# Patient Record
Sex: Male | Born: 2000
Health system: Southern US, Community
[De-identification: ages and names within clinical notes are randomized; demographics above are authoritative.]

## PROBLEM LIST (undated history)

## (undated) DIAGNOSIS — K649 Unspecified hemorrhoids: Secondary | ICD-10-CM

## (undated) DIAGNOSIS — D649 Anemia, unspecified: Secondary | ICD-10-CM

## (undated) DIAGNOSIS — Z803 Family history of malignant neoplasm of breast: Secondary | ICD-10-CM

## (undated) DIAGNOSIS — Z800 Family history of malignant neoplasm of digestive organs: Secondary | ICD-10-CM

## (undated) DIAGNOSIS — K6289 Other specified diseases of anus and rectum: Secondary | ICD-10-CM

## (undated) HISTORY — DX: Family history of malignant neoplasm of digestive organs: Z80.0

## (undated) HISTORY — DX: Other specified diseases of anus and rectum: K62.89

## (undated) HISTORY — DX: Family history of malignant neoplasm of breast: Z80.3

---
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
## 2022-01-17 ENCOUNTER — Ambulatory Visit: Payer: 59 | Admitting: Gastroenterology

## 2022-03-07 ENCOUNTER — Ambulatory Visit: Payer: 59 | Admitting: Orthopedic Surgery

## 2022-03-07 DIAGNOSIS — Z9889 Other specified postprocedural states: Secondary | ICD-10-CM | POA: Diagnosis not present

## 2022-03-10 ENCOUNTER — Encounter: Payer: Self-pay | Admitting: Orthopedic Surgery

## 2022-03-10 NOTE — Progress Notes (Signed)
Office Visit Note   Patient: Glenn Walter           Date of Birth: [birth year]           MRN: [id number] Visit Date: 03/07/2022 Requested by: Nicholes Mango, MD No address on file PCP: Nicholes Mango, MD  Subjective: Chief Complaint  Patient presents with   Left Knee - Follow-up    HPI: Glenn Walter is a 21 year old patient with left knee ACL reconstruction with quad autograft performed 07/04/2021.  He had meniscal work done as well.  Overall he is doing well with no problems.  Primarily doing a lot of weight lifting and not a lot of cutting and pivoting activities.  He states his knee feels normal for the most part.              ROS: All systems reviewed are negative as they relate to the chief complaint within the history of present illness.  Patient denies  fevers or chills.   Assessment & Plan: Visit Diagnoses:  1. S/P ACL reconstruction     Plan: Impression is doing well following left knee ACL reconstruction with quad autograft.  Plan is to continue with strengthening protocol.  Recommended that he avoid running for exercise.  Cutting and pivoting activities should be undertaken with 1 to 2 months of agility training before getting into a game type situation.  Follow-up as needed.  Follow-Up Instructions: No follow-ups on file.   Orders:  No orders of the defined types were placed in this encounter.  No orders of the defined types were placed in this encounter.     Procedures: No procedures performed   Clinical Data: No additional findings.  Objective: Vital Signs: There were no vitals taken for this visit.  Physical Exam:   Constitutional: Patient appears well-developed HEENT:  Head: Normocephalic Eyes:EOM are normal Neck: Normal range of motion Cardiovascular: Normal rate Pulmonary/chest: Effort normal Neurologic: Patient is alert Skin: Skin is warm Psychiatric: Patient has normal mood and affect   Ortho Exam: Ortho exam demonstrates full active and  passive range of motion of the left knee.  No effusion.  Quad strength is excellent with no real appreciable atrophy on physical examination at mid thigh level.  Graft is stable to Lachman and anterior drawer with 1 to 2 mm anterior drawer solid endpoint.  Specialty Comments:  No specialty comments available.  Imaging: No results found.   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 08/29/2021   Rectal mass    Anemia due to GI blood loss 08/20/2021   Acute blood loss anemia (ABLA) 08/19/2021   Iron deficiency anemia 08/19/2021   Left anterior cruciate ligament tear    Acute medial meniscal tear, left, subsequent encounter    Past Medical History:  Diagnosis Date   Anemia    Family history of breast cancer    Family history of colon cancer    Family history of pancreatic cancer    Hemorrhoids     Family History  Problem Relation Age of Onset   Breast cancer Maternal Grandmother 23   Multiple myeloma Paternal Grandmother 22   Kidney cancer Paternal Grandfather 43   Breast cancer Other        MGA, MGGA, PGA   Colon cancer Other  MGU, MGGA x 2   Pancreatic cancer Other        PGA and MGGM    Past Surgical History:  Procedure Laterality Date   ANTERIOR CRUCIATE LIGAMENT REPAIR Left 07/04/2021   Procedure: LEFT KNEE ANTERIOR CRUCIATE LIGAMENT RECONSTRUCTION WITH QUADRICEP AUTOGRAFT AND MEDIAL MENISCAL REPAIR VERSUS DEBRIDEMENT;  Surgeon: Meredith Pel, MD;  Location: [facility];  Service: Orthopedics;  Laterality: Left;   BIOPSY  08/20/2021   Procedure: BIOPSY;  Surgeon: Carol Ada, MD;  Location[facility];  Service: Endoscopy;;   BIOPSY  08/21/2021   Procedure: BIOPSY;  Surgeon: Thornton Park, MD;  Location: [facility];  Service: Gastroenterology;;    COLONOSCOPY WITH PROPOFOL N/A 08/21/2021   Procedure: COLONOSCOPY WITH PROPOFOL;  Surgeon: Thornton Park, MD;  Location: [facility];  Service: Gastroenterology;  Laterality: N/A;   ESOPHAGOGASTRODUODENOSCOPY (EGD) WITH PROPOFOL N/A 08/20/2021   Procedure: ESOPHAGOGASTRODUODENOSCOPY (EGD) WITH PROPOFOL;  Surgeon: Carol Ada, MD;  Location: [facility];  Service: Endoscopy;  Laterality: N/A;   PARTIAL PROCTECTOMY BY TEM N/A 10/05/2021   Procedure: TEM PARTIAL PORCTECTOMY OF RECTAL MASS, PARTIAL RESECTION OF RECTAL MASS, PARTIAL PROCTECTOMY;  Surgeon: Michael Boston, MD;  [facility];  Service: General;  Laterality: N/A;   Social History   Occupational History   Not on file  Tobacco Use   Smoking status: Never   Smokeless tobacco: Never  Vaping Use   Vaping Use: Never used  Substance and Sexual Activity   Alcohol use: Never   Drug use: Never   Sexual activity: Not Currently

## 2022-03-28 ENCOUNTER — Other Ambulatory Visit (INDEPENDENT_AMBULATORY_CARE_PROVIDER_SITE_OTHER): Payer: 59

## 2022-03-28 ENCOUNTER — Other Ambulatory Visit: Payer: Self-pay

## 2022-03-28 ENCOUNTER — Encounter: Payer: Self-pay | Admitting: Gastroenterology

## 2022-03-28 ENCOUNTER — Ambulatory Visit: Payer: 59 | Admitting: Gastroenterology

## 2022-03-28 ENCOUNTER — Telehealth: Payer: Self-pay | Admitting: Physician Assistant

## 2022-03-28 VITALS — BP 110/80 | HR 72 | Ht 72.25 in | Wt 211.1 lb

## 2022-03-28 DIAGNOSIS — D500 Iron deficiency anemia secondary to blood loss (chronic): Secondary | ICD-10-CM | POA: Diagnosis not present

## 2022-03-28 DIAGNOSIS — K625 Hemorrhage of anus and rectum: Secondary | ICD-10-CM

## 2022-03-28 DIAGNOSIS — D509 Iron deficiency anemia, unspecified: Secondary | ICD-10-CM

## 2022-03-28 LAB — IBC + FERRITIN
Ferritin: 4.9 ng/mL — ABNORMAL LOW (ref 22.0–322.0)
Iron: 60 ug/dL (ref 42–165)
Saturation Ratios: 11.1 % — ABNORMAL LOW (ref 20.0–50.0)
TIBC: 539 ug/dL — ABNORMAL HIGH (ref 250.0–450.0)
Transferrin: 385 mg/dL — ABNORMAL HIGH (ref 212.0–360.0)

## 2022-03-28 LAB — CBC WITH DIFFERENTIAL/PLATELET
Basophils Absolute: 0.1 10*3/uL (ref 0.0–0.1)
Basophils Relative: 2 % (ref 0.0–3.0)
Eosinophils Absolute: 0.1 10*3/uL (ref 0.0–0.7)
Eosinophils Relative: 1.4 % (ref 0.0–5.0)
HCT: 39.7 % (ref 36.0–49.0)
Hemoglobin: 12.6 g/dL (ref 12.0–16.0)
Lymphocytes Relative: 21.3 % — ABNORMAL LOW (ref 24.0–48.0)
Lymphs Abs: 1.2 10*3/uL (ref 0.7–4.0)
MCHC: 31.7 g/dL (ref 31.0–37.0)
MCV: 77.6 fl — ABNORMAL LOW (ref 78.0–98.0)
Monocytes Absolute: 0.7 10*3/uL (ref 0.1–1.0)
Monocytes Relative: 12.5 % — ABNORMAL HIGH (ref 3.0–12.0)
Neutro Abs: 3.5 10*3/uL (ref 1.4–7.7)
Neutrophils Relative %: 62.8 % (ref 43.0–71.0)
Platelets: 273 10*3/uL (ref 150.0–575.0)
RBC: 5.11 Mil/uL (ref 3.80–5.70)
RDW: 17.7 % — ABNORMAL HIGH (ref 11.4–15.5)
WBC: 5.6 10*3/uL (ref 4.5–13.5)

## 2022-03-28 NOTE — Progress Notes (Signed)
Error

## 2022-03-28 NOTE — Telephone Encounter (Signed)
Scheduled appt per 8/30 referral. Pt is aware of appt date and time. Pt is aware to arrive 15 mins prior to appt time and to bring and updated insurance card. Pt is aware of appt location.   

## 2022-03-28 NOTE — Progress Notes (Signed)
Referring Provider: Nicholes Mango, MD Primary Care Physician:  Nicholes Mango, MD   Reason for Consultation:  Colon polyp   IMPRESSION:  Status post TEM for large rectal tubular adenoma with high grade dysplasia Initially presenting with rectal bleeding and iron deficiency anemia Family history somewhat suggestive of a hereditary cancer syndrome  PLAN: - CBC, ferritin, iron - If iron deficiency still present, will refer to hematology to consider IV iron - Surveillance colonoscopy due 09/2024, earlier with new symptoms - Parents should have a colonoscopy now as they are at high risk - Genetic screening strongly encouraged as recommended by the genetic counselor - Emerging evidence supports eating a diet of fruits, vegetables, grans, calcium, and yogurt while reducing red meat and alcohol may reduce the risk of colon cancer.   HPI: [name] is a 21 y.o. male who returns in follow-up after transanal endoscopic microsurgery 10/05/2021 of a 4 cm distal rectal tubular adenoma with high-grade dysplasia that presented with several years of rectal bleeding and iron deficiency anemia.  He has had an uneventful postoperative course.  He returns today in follow-up accompanied by his mother.  He saw the genetic counselor in May.  He and his parents are considering strategies for genetic investigation.  His energy is improved but he still feels somewhat weak.  He has had no further bleeding.  GI review of systems is negative.  He has been taking daily iron supplements.  He is currently working at [employer] after graduated from [employer] in May.  His parents have not had colon cancer screening.      Past Medical History:  Diagnosis Date   Anemia    Family history of breast cancer    Family history of colon cancer    Family history of pancreatic cancer    Hemorrhoids    Rectal mass     Past Surgical History:  Procedure Laterality Date   ANTERIOR CRUCIATE LIGAMENT REPAIR Left  07/04/2021   Procedure: LEFT KNEE ANTERIOR CRUCIATE LIGAMENT RECONSTRUCTION WITH QUADRICEP AUTOGRAFT AND MEDIAL MENISCAL REPAIR VERSUS DEBRIDEMENT;  Surgeon: Meredith Pel, MD;  Location: [facility];  Service: Orthopedics;  Laterality: Left;   BIOPSY  08/20/2021   Procedure: BIOPSY;  Surgeon: Carol Ada, MD;  [facility];  Service: Endoscopy;;   BIOPSY  08/21/2021   Procedure: BIOPSY;  Surgeon: Thornton Park, MD;  Location: [facility];  Service: Gastroenterology;;   COLONOSCOPY WITH PROPOFOL N/A 08/21/2021   Procedure: COLONOSCOPY WITH PROPOFOL;  Surgeon: Thornton Park, MD;  Location: [facility];  Service: Gastroenterology;  Laterality: N/A;   ESOPHAGOGASTRODUODENOSCOPY (EGD) WITH PROPOFOL N/A 08/20/2021   Procedure: ESOPHAGOGASTRODUODENOSCOPY (EGD) WITH PROPOFOL;  Surgeon: Carol Ada, MD;  Location: [facility];  Service: Endoscopy;  Laterality: N/A;   PARTIAL PROCTECTOMY BY TEM N/A 10/05/2021   Procedure: TEM PARTIAL PORCTECTOMY OF RECTAL MASS, PARTIAL RESECTION OF RECTAL MASS, PARTIAL PROCTECTOMY;  Surgeon: Michael Boston, MD;  Location: [facility];  Service: General;  Laterality: N/A;     Current Outpatient Medications  Medication Sig Dispense Refill   ferrous sulfate 325 (65 FE) MG tablet Take 1 tablet (325 mg total) by mouth 2 (two) times daily with a meal. (Patient taking differently: Take 325 mg by mouth as needed.) 60 tablet 2   omeprazole (PRILOSEC) 40 MG capsule Take 1 capsule (40 mg total) by mouth 2 (two) times daily for 28 days. 56 capsule 0   diazepam (VALIUM) 5 MG tablet Take 1 tablet (5 mg total) by mouth  every 8 (eight) hours as needed for muscle spasms (difficulty urinating). (Patient not taking: Reported on 03/28/2022) 6 tablet 1   oxyCODONE (OXY IR/ROXICODONE) 5 MG immediate release tablet Take 1 tablet (5 mg total) by mouth every 6 (six) hours as needed for moderate pain, severe pain or breakthrough pain. (Patient not taking: Reported on 03/28/2022) 30  tablet 0   polyethylene glycol powder (GLYCOLAX/MIRALAX) 17 GM/SCOOP powder Take 233.75 g by mouth once for 1 dose according to your procedure prep instructions. (Patient not taking: Reported on 03/28/2022) 238 g 0   No current facility-administered medications for this visit.    Allergies as of 03/28/2022   (No Known Allergies)    Family History  Problem Relation Age of Onset   Diabetes Mother    Hypertension Father    Hyperlipidemia Father    Breast cancer Maternal Grandmother 34   Liver disease Maternal Grandmother    Diabetes Maternal Grandfather    Kidney disease Maternal Grandfather    Multiple myeloma Paternal Grandmother 17   Breast cancer Paternal Grandmother    Kidney cancer Paternal Grandfather 100   Heart disease Paternal Grandfather    Colon polyps Paternal Uncle    Breast cancer Other        MGA,  PGA   Colon cancer Other        MGU, MGGA x 2   Pancreatic cancer Other        PGA   Breast cancer Paternal Aunt    Pancreatic cancer Maternal Great-grandmother       Physical Exam: Gen: Awake, alert, and oriented, and well communicative. HEENT: EOMI, non-icteric sclera, NCAT, MMM  Neck: Normal movement of head and neck  Pulm: No labored breathing, speaking in full sentences without conversational dyspnea  Derm: No apparent lesions or bruising in visible field  MS: Moves all visible extremities without noticeable abnormality  Psych: Pleasant, cooperative, normal speech, thought processing seemingly intact      [name], MD, MPH 03/28/2022, 9:05 AM

## 2022-03-28 NOTE — Patient Instructions (Addendum)
It was my pleasure to provide care to you today. Based on our discussion, I am providing you with my recommendations below:  RECOMMENDATION(S):   I am so glad that you are doing so well following your surgery.  Please have labs checked today. If you continue to have low iron stores continue to be low would like for you to see a hematologist to consider IV iron.  You should have a colonoscopy in 3 years, earlier with new symptoms.  Your parents should start colon cancer screening now.   Any brothers or sisters will also need to start colon cancer screening early.   Emerging evidence supports eating a diet of fruits, vegetables, grains, calcium, and yogurt while reducing red meat and alcohol may reduce the risk of colon cancer.  FOLLOW UP:  After your blood test results, you will receive a call from my office staff regarding my recommendation for follow up.  BMI:  If you are age 19 or older, your body mass index should be between 23-30. Your Body mass index is 28.44 kg/m. If this is out of the aforementioned range listed, please consider follow up with your Primary Care Provider.  If you are age 59 or younger, your body mass index should be between 19-25. Your Body mass index is 28.44 kg/m. If this is out of the aformentioned range listed, please consider follow up with your Primary Care Provider.   MY CHART:  The [facility] providers would like to encourage you to use [facility] to communicate with providers for non-urgent requests or questions.  Due to long hold times on the telephone, sending your provider a message by [facility] may be a faster and more efficient way to get a response.  Please allow 48 business hours for a response.  Please remember that this is for non-urgent requests.   Thank you for trusting me with your gastrointestinal care!    Thornton Park, MD, MPH

## 2022-04-12 ENCOUNTER — Telehealth: Payer: Self-pay | Admitting: Physician Assistant

## 2022-04-12 NOTE — Telephone Encounter (Signed)
R/s pt's new hem appt per pt request. Pt is aware of new appt date/time.  

## 2022-04-18 ENCOUNTER — Other Ambulatory Visit: Payer: 59

## 2022-04-18 ENCOUNTER — Encounter: Payer: 59 | Admitting: Physician Assistant

## 2022-05-08 NOTE — Progress Notes (Signed)
[place] Telephone:(336) [phone]   Fax:([phone]) [place] NOTE  Patient Care Team: Nicholes Mango, MD as PCP - General (Internal Medicine) Michael Boston, MD as Consulting Physician (General Surgery) Thornton Park, MD as Consulting Physician (Gastroenterology) Carol Ada, MD as Consulting Physician (Gastroenterology)  HEMATOLOGY HISTORY: 08/19/2021-08/22/2021: Admitted for progressive weakness, fatigue, intermittent dyspnea and dark/black stools.  Hemoglobin was 3.9, iron 10, iron saturation 2%, ferritin 3.  FOBT positive.  She received 5 units of PRBC.  Underwent EGD which showed gastritis, biopsy taken which was negative for H. pylori or any malignancy.  Colonoscopy showed large rectal polyp of about 4 cm.  Pathology was consistent with tubular adenoma with high-grade dysplasia.Marland Kitchen  CEA was negative and CT CAP was negative for malignancy.  Started on p.o. iron therapy 10/05/2021: Underwent partial proctectomy of rectal mass with Dr. Michael Boston.  Pathology revealed tubular adenoma with high-grade dysplasia.  No evidence of invasive carcinoma. 05/09/2022: Established care with [facility]  CHIEF COMPLAINTS/PURPOSE OF CONSULTATION:  Iron deficiency anemia  HISTORY OF PRESENTING ILLNESS:  Brittany Amirault 21 y.o. male presents to the hematology clinic for evaluation for iron deficiency anemia.  He is accompanied by his mother for this visit.  On exam today, Mr. Vasco reports compliance with taking ferrous sulfate 325 mg once daily.  He denies any side effects with taking p.o. iron including constipation.  Since undergoing surgery in March 2023, he denies subsequent episodes of rectal bleeding.  Patient reports having fatigue that presents in the afternoon.  He does require drinking caffeine to improve his energy levels.  He has a good appetite and denies any nausea or vomiting.  His bowel habits are unchanged without any recurrent episodes of diarrhea or  constipation.  Patient denies fevers, chills, night sweats, shortness of breath, chest pain or cough.  He has no other complaints.  Rest of 10 point ROS is below.   MEDICAL HISTORY:  Past Medical History:  Diagnosis Date   Anemia    Family history of breast cancer    Family history of colon cancer    Family history of pancreatic cancer    Hemorrhoids    Rectal mass     SURGICAL HISTORY: Past Surgical History:  Procedure Laterality Date   ANTERIOR CRUCIATE LIGAMENT REPAIR Left 07/04/2021   Procedure: LEFT KNEE ANTERIOR CRUCIATE LIGAMENT RECONSTRUCTION WITH QUADRICEP AUTOGRAFT AND MEDIAL MENISCAL REPAIR VERSUS DEBRIDEMENT;  Surgeon: Meredith Pel, MD;  Location: [facility];  Service: Orthopedics;  Laterality: Left;   BIOPSY  08/20/2021   Procedure: BIOPSY;  Surgeon: Carol Ada, MD;  Location: [facility] ENDOSCOPY;  Service: Endoscopy;;   BIOPSY  08/21/2021   Procedure: BIOPSY;  Surgeon: Thornton Park, MD;  Location: [facility];  Service: Gastroenterology;;   COLONOSCOPY WITH PROPOFOL N/A 08/21/2021   Procedure: COLONOSCOPY WITH PROPOFOL;  Surgeon: Thornton Park, MD;  Location: [facility];  Service: Gastroenterology;  Laterality: N/A;   ESOPHAGOGASTRODUODENOSCOPY (EGD) WITH PROPOFOL N/A 08/20/2021   Procedure: ESOPHAGOGASTRODUODENOSCOPY (EGD) WITH PROPOFOL;  Surgeon: Carol Ada, MD;  Location: [facility];  Service: Endoscopy;  Laterality: N/A;   PARTIAL PROCTECTOMY BY TEM N/A 10/05/2021   Procedure: TEM PARTIAL PORCTECTOMY OF RECTAL MASS, PARTIAL RESECTION OF RECTAL MASS, PARTIAL PROCTECTOMY;  Surgeon: Michael Boston, MD;  Location: [facility];  Service: General;  Laterality: N/A;    SOCIAL HISTORY: Social History   Socioeconomic History   Marital status: Single    Spouse name: Not on file   Number of children: 0  Years of education: Not on file   Highest education level: Not on file  Occupational History   Occupation: Neurosurgeon  Tobacco Use   Smoking status:  Never   Smokeless tobacco: Never  Vaping Use   Vaping Use: Never used  Substance and Sexual Activity   Alcohol use: Never   Drug use: Never   Sexual activity: Not Currently  Other Topics Concern   Not on file  Social History Narrative   Not on file   Social Determinants of Health   Financial Resource Strain: Not on file  Food Insecurity: Not on file  Transportation Needs: Not on file  Physical Activity: Not on file  Stress: Not on file  Social Connections: Not on file  Intimate Partner Violence: Not on file    FAMILY HISTORY: Family History  Problem Relation Age of Onset   Diabetes Mother    Hypertension Father    Hyperlipidemia Father    Breast cancer Maternal Grandmother 8   Liver disease Maternal Grandmother    Diabetes Maternal Grandfather    Kidney disease Maternal Grandfather    Multiple myeloma Paternal Grandmother 61   Breast cancer Paternal Grandmother    Kidney cancer Paternal Grandfather 84   Heart disease Paternal Grandfather    Colon polyps Paternal Uncle    Breast cancer Other        MGA,  PGA   Colon cancer Other        MGU, MGGA x 2   Pancreatic cancer Other        PGA   Breast cancer Paternal Aunt    Pancreatic cancer Maternal Great-grandmother     ALLERGIES:  has No Known Allergies.  MEDICATIONS:  Current Outpatient Medications  Medication Sig Dispense Refill   ferrous sulfate 325 (65 FE) MG tablet Take 1 tablet (325 mg total) by mouth 2 (two) times daily with a meal. (Patient taking differently: Take 325 mg by mouth as needed.) 60 tablet 2   No current facility-administered medications for this visit.    REVIEW OF SYSTEMS:   Constitutional: ( - ) fevers, ( - )  chills , ( - ) night sweats Eyes: ( - ) blurriness of vision, ( - ) double vision, ( - ) watery eyes Ears, nose, mouth, throat, and face: ( - ) mucositis, ( - ) sore throat Respiratory: ( - ) cough, ( - ) dyspnea, ( - ) wheezes Cardiovascular: ( - ) palpitation, ( - ) chest  discomfort, ( - ) lower extremity swelling Gastrointestinal:  ( - ) nausea, ( - ) heartburn, ( - ) change in bowel habits Skin: ( - ) abnormal skin rashes Lymphatics: ( - ) new lymphadenopathy, ( - ) easy bruising Neurological: ( - ) numbness, ( - ) tingling, ( - ) new weaknesses Behavioral/Psych: ( - ) mood change, ( - ) new changes  All other systems were reviewed with the patient and are negative.  PHYSICAL EXAMINATION: ECOG PERFORMANCE STATUS: 1 - Symptomatic but completely ambulatory  Vitals:   05/09/22 1220  BP: (!) 145/74  Pulse: 60  Resp: 16  Temp: 97.7 F (36.5 C)  SpO2: 100%   Filed Weights   05/09/22 1220  Weight: 217 lb 6.4 oz (98.6 kg)    GENERAL: well appearing male in NAD  SKIN: skin color, texture, turgor are normal, no rashes or significant lesions EYES: conjunctiva are pink and non-injected, sclera clear OROPHARYNX: no exudate, no erythema; lips, buccal mucosa, and tongue  normal  NECK: supple, non-tender LYMPH:  no palpable lymphadenopathy in the cervical or supraclavicular lymph nodes.  LUNGS: clear to auscultation and percussion with normal breathing effort HEART: regular rate & rhythm and no murmurs and no lower extremity edema Musculoskeletal: no cyanosis of digits and no clubbing  PSYCH: alert & oriented x 3, fluent speech NEURO: no focal motor/sensory deficits  LABORATORY DATA:  I have reviewed the data as listed    Latest Ref Rng & Units 05/09/2022    1:36 PM 03/28/2022    9:32 AM 09/29/2021   11:17 AM  CBC  WBC 4.0 - 10.5 K/uL 6.5  5.6  6.2   Hemoglobin 13.0 - 17.0 g/dL 13.5  12.6  13.0   Hematocrit 39.0 - 52.0 % 41.8  39.7  41.2   Platelets 150 - 400 K/uL 293  273.0  275        Latest Ref Rng & Units 05/09/2022    1:36 PM 08/20/2021   10:05 AM 08/19/2021    3:59 PM  CMP  Glucose 70 - 99 mg/dL 87  89  95   BUN 6 - 20 mg/dL 17  6  7    Creatinine 0.61 - 1.24 mg/dL 0.72  0.90  0.83   Sodium 135 - 145 mmol/L 139  140  137   Potassium 3.5 -  5.1 mmol/L 4.1  3.9  3.8   Chloride 98 - 111 mmol/L 105  103  104   CO2 22 - 32 mmol/L 26   22   Calcium 8.9 - 10.3 mg/dL 9.2   9.1   Total Protein 6.5 - 8.1 g/dL 8.0   6.8   Total Bilirubin 0.3 - 1.2 mg/dL 1.6   0.9   Alkaline Phos 38 - 126 U/L 79   75   AST 15 - 41 U/L 21   18   ALT 0 - 44 U/L 20   10     RADIOGRAPHIC STUDIES: I have personally reviewed the radiological images as listed and agreed with the findings in the report. No results found.  ASSESSMENT & PLAN Glenn Walter is a 21 y.o. male who presents to the hematology clinic for iron deficiency anemia.  Mr. Blankenship is consistently taking ferrous sulfate 325 mg once a day with a source of vitamin C.  He denies any subsequent episodes of rectal bleeding since undergoing partial proctectomy of rectal mass on 10/05/2021.  He is under the care of gastroenterologist, Dr. Modena Nunnery for repeat endoscopic evaluation for surveillance.  Patient will proceed with laboratory evaluation today to check CBC, CMP and iron panel.  If there is evidence of persistent iron deficiency, we will arrange for IV iron to help bolster levels.  Patient is from [place], [place] so we will arrange for infusion and future follow-ups at [facility] in [place].  #Iron deficiency anemia 2/2 GI bleeding: --Colonoscopy from 08/21/2021 showed large rectal mass measuring 4 cm.  Pathology revealed tubular adenoma with high-grade dysplasia. --Underwent partial proctectomy of rectal mass on 10/05/2021 with Dr. Johney Maine.  Pathology revealed tubular adenoma with high-grade dysplasia.  No evidence of malignancy. --Next screening colonoscopy will be due on March 2026, earlier if there are new symptoms. He is under the care of Dr. Tarri Glenn, gastroenterologist. --She denies any evidence of GI bleeding including hematochezia or melena. --Currently on ferrous sulfate 325 mg once a day with a source of vitamin C --Labs today to check CBC, CMP, reticulocyte panel,  ferritin, iron and TIBC  levels. --Consider IV iron if there is persistent iron deficiency from today's labs. --Provided list of iron rich foods to incorporate into his diet. -- Patient lives in [place] so we will times for his care to our colleagues at [facility] in [place].   Orders Placed This Encounter  Procedures   CBC with Differential ([facility] Only)    Standing Status:   Future    Number of Occurrences:   1    Standing Expiration Date:   05/09/2023   CMP ([facility] only)    Standing Status:   Future    Number of Occurrences:   1    Standing Expiration Date:   05/09/2023   Ferritin    Standing Status:   Future    Number of Occurrences:   1    Standing Expiration Date:   05/09/2023   Iron and Iron Binding Capacity ([facility] only)    Standing Status:   Future    Number of Occurrences:   1    Standing Expiration Date:   05/09/2023   Retic Panel    Standing Status:   Future    Number of Occurrences:   1    Standing Expiration Date:   05/09/2023    All questions were answered. The patient knows to call the clinic with any problems, questions or concerns.  I have spent a total of 60 minutes minutes of face-to-face and non-face-to-face time, preparing to see the patient, obtaining and/or reviewing separately obtained history, performing a medically appropriate examination, counseling and educating the patient,  referring and communicating with other health care professionals, documenting clinical information in the electronic health record, and care coordination.   Dede Query, PA-C Department of Hematology/Oncology [place] at [facility] Phone: [phone]  Patient was seen with Dr. Lorenso Courier  I have read the above note and personally examined the patient. I agree with the assessment and plan as noted above.  Briefly Mr. Geremiah Fussell is a 21 year old male with medical history significant for large rectal adenoma who  presents for evaluation of iron deficiency anemia.  The patient is accompanied by his mother today.  At this time he denies any overt signs of bright red blood in his stool.  He notes that he currently works at [employer] and is able to form his daily tasks without difficulty.  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  He reports that he feels great overall.  He otherwise denies any lightheadedness, dizziness, or shortness of breath.  We will assess his blood work today to see if he requires any iron supplementation.  Additionally would recommend continued follow up/surveillance with GI for this large rectal lesion.   Ledell Peoples, MD Department of Hematology/Oncology [place] at [facility] Phone: [phone] Pager: [phone] Email: [email or web address]

## 2022-05-09 ENCOUNTER — Encounter: Payer: Self-pay | Admitting: Physician Assistant

## 2022-05-09 ENCOUNTER — Other Ambulatory Visit: Payer: Self-pay

## 2022-05-09 ENCOUNTER — Inpatient Hospital Stay: Payer: 59 | Attending: Physician Assistant | Admitting: Physician Assistant

## 2022-05-09 ENCOUNTER — Inpatient Hospital Stay: Payer: 59

## 2022-05-09 VITALS — BP 145/74 | HR 60 | Temp 97.7°F | Resp 16 | Wt 217.4 lb

## 2022-05-09 DIAGNOSIS — R531 Weakness: Secondary | ICD-10-CM | POA: Insufficient documentation

## 2022-05-09 DIAGNOSIS — Z79899 Other long term (current) drug therapy: Secondary | ICD-10-CM | POA: Insufficient documentation

## 2022-05-09 DIAGNOSIS — K6289 Other specified diseases of anus and rectum: Secondary | ICD-10-CM | POA: Insufficient documentation

## 2022-05-09 DIAGNOSIS — Z8719 Personal history of other diseases of the digestive system: Secondary | ICD-10-CM | POA: Insufficient documentation

## 2022-05-09 DIAGNOSIS — Z833 Family history of diabetes mellitus: Secondary | ICD-10-CM | POA: Insufficient documentation

## 2022-05-09 DIAGNOSIS — D500 Iron deficiency anemia secondary to blood loss (chronic): Secondary | ICD-10-CM | POA: Diagnosis not present

## 2022-05-09 DIAGNOSIS — Z8349 Family history of other endocrine, nutritional and metabolic diseases: Secondary | ICD-10-CM | POA: Insufficient documentation

## 2022-05-09 DIAGNOSIS — Z83719 Family history of colon polyps, unspecified: Secondary | ICD-10-CM | POA: Diagnosis not present

## 2022-05-09 DIAGNOSIS — Z8249 Family history of ischemic heart disease and other diseases of the circulatory system: Secondary | ICD-10-CM | POA: Diagnosis not present

## 2022-05-09 DIAGNOSIS — R5383 Other fatigue: Secondary | ICD-10-CM | POA: Diagnosis not present

## 2022-05-09 DIAGNOSIS — Z800 Family history of malignant neoplasm of digestive organs: Secondary | ICD-10-CM | POA: Insufficient documentation

## 2022-05-09 DIAGNOSIS — R194 Change in bowel habit: Secondary | ICD-10-CM | POA: Diagnosis not present

## 2022-05-09 DIAGNOSIS — Z8051 Family history of malignant neoplasm of kidney: Secondary | ICD-10-CM | POA: Insufficient documentation

## 2022-05-09 DIAGNOSIS — R0600 Dyspnea, unspecified: Secondary | ICD-10-CM | POA: Diagnosis not present

## 2022-05-09 DIAGNOSIS — Z803 Family history of malignant neoplasm of breast: Secondary | ICD-10-CM | POA: Insufficient documentation

## 2022-05-09 DIAGNOSIS — Z8379 Family history of other diseases of the digestive system: Secondary | ICD-10-CM | POA: Diagnosis not present

## 2022-05-09 DIAGNOSIS — K922 Gastrointestinal hemorrhage, unspecified: Secondary | ICD-10-CM | POA: Insufficient documentation

## 2022-05-09 LAB — CMP (CANCER CENTER ONLY)
ALT: 20 U/L (ref 0–44)
AST: 21 U/L (ref 15–41)
Albumin: 4.5 g/dL (ref 3.5–5.0)
Alkaline Phosphatase: 79 U/L (ref 38–126)
Anion gap: 8 (ref 5–15)
BUN: 17 mg/dL (ref 6–20)
CO2: 26 mmol/L (ref 22–32)
Calcium: 9.2 mg/dL (ref 8.9–10.3)
Chloride: 105 mmol/L (ref 98–111)
Creatinine: 0.72 mg/dL (ref 0.61–1.24)
GFR, Estimated: >60 mL/min (ref >60)
Glucose, Bld: 87 mg/dL (ref 70–99)
Potassium: 4.1 mmol/L (ref 3.5–5.1)
Sodium: 139 mmol/L (ref 135–145)
Total Bilirubin: 1.6 mg/dL — ABNORMAL HIGH (ref 0.3–1.2)
Total Protein: 8 g/dL (ref 6.5–8.1)

## 2022-05-09 LAB — CBC WITH DIFFERENTIAL (CANCER CENTER ONLY)
Abs Immature Granulocytes: 0.02 10*3/uL (ref 0.00–0.07)
Basophils Absolute: 0.1 10*3/uL (ref 0.0–0.1)
Basophils Relative: 1 %
Eosinophils Absolute: 0.1 10*3/uL (ref 0.0–0.5)
Eosinophils Relative: 1 %
HCT: 41.8 % (ref 39.0–52.0)
Hemoglobin: 13.5 g/dL (ref 13.0–17.0)
Immature Granulocytes: 0 %
Lymphocytes Relative: 19 %
Lymphs Abs: 1.2 10*3/uL (ref 0.7–4.0)
MCH: 26.1 pg (ref 26.0–34.0)
MCHC: 32.3 g/dL (ref 30.0–36.0)
MCV: 80.9 fL (ref 80.0–100.0)
Monocytes Absolute: 0.7 10*3/uL (ref 0.1–1.0)
Monocytes Relative: 11 %
Neutro Abs: 4.4 10*3/uL (ref 1.7–7.7)
Neutrophils Relative %: 68 %
Platelet Count: 293 10*3/uL (ref 150–400)
RBC: 5.17 MIL/uL (ref 4.22–5.81)
RDW: 19.2 % — ABNORMAL HIGH (ref 11.5–15.5)
WBC Count: 6.5 10*3/uL (ref 4.0–10.5)
nRBC: 0 % (ref 0.0–0.2)

## 2022-05-09 LAB — RETIC PANEL
Immature Retic Fract: 17.5 % — ABNORMAL HIGH (ref 2.3–15.9)
RBC.: 5.16 MIL/uL (ref 4.22–5.81)
Retic Count, Absolute: 64.5 10*3/uL (ref 19.0–186.0)
Retic Ct Pct: 1.3 % (ref 0.4–3.1)
Reticulocyte Hemoglobin: 31.5 pg (ref >27.9)

## 2022-05-09 LAB — IRON AND IRON BINDING CAPACITY (CC-WL,HP ONLY)
Iron: 173 ug/dL (ref 45–182)
Saturation Ratios: 31 % (ref 17.9–39.5)
TIBC: 561 ug/dL — ABNORMAL HIGH (ref 250–450)
UIBC: 388 ug/dL

## 2022-05-09 LAB — FERRITIN: Ferritin: 6 ng/mL — ABNORMAL LOW (ref 24–336)

## 2022-05-10 ENCOUNTER — Other Ambulatory Visit: Payer: Self-pay | Admitting: Hematology and Oncology

## 2022-05-10 ENCOUNTER — Telehealth: Payer: Self-pay | Admitting: Oncology

## 2022-05-10 ENCOUNTER — Telehealth: Payer: Self-pay | Admitting: Physician Assistant

## 2022-05-10 ENCOUNTER — Telehealth: Payer: Self-pay | Admitting: Orthopedic Surgery

## 2022-05-10 NOTE — Telephone Encounter (Signed)
I left a voicemail for patient to review lab results from yesterday.  Findings are consistent with persistent iron deficiency.  Patient will require IV iron to help bolster levels.  Patient has requested to receive treatment at [facility] so we will transfer care to our colleagues at the [facility] in [place].  Left number to call back if he has any questions or concerns.

## 2022-05-10 NOTE — Telephone Encounter (Signed)
IC s/w patient. He stated he was jogging about 1 week ago and while running stepped into deep pot hole. Since then has had some pain with running. No pain with rest. Denies any swelling, weakness or giving way.  Does not wake with pain. Michela Pitcher that it does feel more like muscle soreness than true pain.  Wants to make sure he has not messed up his knee. Contact for patient is [phone] or [phone]

## 2022-05-10 NOTE — Telephone Encounter (Signed)
Contacted pt to schedule an appt. Unable to reach via phone, voicemail was left.    Good Morning. This pt saw one of our providers here at [facility] long but wished to go to [facility] :Patient will transfer care to [facility]. 05/09/2022 1401 - Glenn Brigham, PA-C Check-out note: 3 months: labs and f/u visit with Dr. Federico Flake or Rosanne Sack at [facility].

## 2022-05-10 NOTE — Telephone Encounter (Signed)
Not sure there's any real way to rule out any injury to the knee without actually examining him in the clinic. I'd say he should take a break from running for a week and then ease back but if he has persistent pain or has noticed any swelling/bruising/instability since the injury then he should come in for eval.  I'll defer to Dr Marlou Sa if he thinks otherwise

## 2022-05-10 NOTE — Telephone Encounter (Signed)
Pt's mother Lenna Sciara called she did not leave a reason she need call back from [place]. Please call her at [phone].

## 2022-05-11 ENCOUNTER — Telehealth: Payer: Self-pay | Admitting: Orthopedic Surgery

## 2022-05-11 NOTE — Telephone Encounter (Signed)
IC s/w patient and advised. He will give it another week or so and call for appointment if needed

## 2022-05-11 NOTE — Telephone Encounter (Signed)
Received call from patient. He is requesting his medical records to be faxed to his recruiter, Joana Reamer. I accepted verbal authorization. I faxed records 06-13-21 - present [phone]. Pts callback [phone]

## 2022-05-12 ENCOUNTER — Encounter: Payer: Self-pay | Admitting: Hematology and Oncology

## 2022-05-14 ENCOUNTER — Telehealth: Payer: Self-pay | Admitting: Hematology and Oncology

## 2022-05-14 NOTE — Telephone Encounter (Signed)
thx

## 2022-05-14 NOTE — Telephone Encounter (Signed)
Patient requested I call back at a later time.   Scheduling Message Entered by Rosanne Sack A on 05/10/2022 at  3:37 PM Priority: Routine <No visit type provided>  Department: [facility]  Provider:   Scheduling Notes:  Transfer from [facility] with iron deficiency. Please schedule for Venofer x 5 next available. No auth needed per Butch Penny. Will need lab and f/u with me in 8 weeks. Thanks

## 2022-05-15 ENCOUNTER — Telehealth: Payer: Self-pay | Admitting: Hematology and Oncology

## 2022-05-15 NOTE — Telephone Encounter (Signed)
05/15/22 Spoke with patient and scheduled IV Iron.

## 2022-05-17 ENCOUNTER — Other Ambulatory Visit: Payer: Self-pay | Admitting: Pharmacist

## 2022-05-17 MED FILL — Iron Sucrose Inj 20 MG/ML (Fe Equiv): INTRAVENOUS | Qty: 10 | Status: AC

## 2022-05-18 ENCOUNTER — Inpatient Hospital Stay: Payer: 59

## 2022-05-18 VITALS — BP 142/62 | HR 78 | Temp 98.4°F | Resp 17

## 2022-05-18 DIAGNOSIS — Z8719 Personal history of other diseases of the digestive system: Secondary | ICD-10-CM | POA: Diagnosis not present

## 2022-05-18 DIAGNOSIS — D500 Iron deficiency anemia secondary to blood loss (chronic): Secondary | ICD-10-CM | POA: Diagnosis not present

## 2022-05-18 DIAGNOSIS — K6289 Other specified diseases of anus and rectum: Secondary | ICD-10-CM | POA: Diagnosis not present

## 2022-05-18 DIAGNOSIS — K922 Gastrointestinal hemorrhage, unspecified: Secondary | ICD-10-CM | POA: Diagnosis not present

## 2022-05-18 DIAGNOSIS — Z79899 Other long term (current) drug therapy: Secondary | ICD-10-CM | POA: Diagnosis not present

## 2022-05-18 DIAGNOSIS — R5383 Other fatigue: Secondary | ICD-10-CM | POA: Diagnosis not present

## 2022-05-18 DIAGNOSIS — R531 Weakness: Secondary | ICD-10-CM | POA: Diagnosis not present

## 2022-05-18 DIAGNOSIS — R0600 Dyspnea, unspecified: Secondary | ICD-10-CM | POA: Diagnosis not present

## 2022-05-18 DIAGNOSIS — R194 Change in bowel habit: Secondary | ICD-10-CM | POA: Diagnosis not present

## 2022-05-18 MED ORDER — SODIUM CHLORIDE 0.9 % IV SOLN
200.0000 mg | Freq: Once | INTRAVENOUS | Status: AC
  Administered 2022-05-18: 200 mg via INTRAVENOUS
  Filled 2022-05-18: qty 200

## 2022-05-18 MED ORDER — SODIUM CHLORIDE 0.9 % IV SOLN: Freq: Once | INTRAVENOUS | Status: AC

## 2022-05-18 NOTE — Patient Instructions (Signed)

## 2022-05-18 NOTE — Progress Notes (Signed)
Patient tolerated Venofer infusion well, no questions/concerns voiced. Patient stable at discharge. AVS given.   ?

## 2022-05-22 ENCOUNTER — Inpatient Hospital Stay: Payer: 59

## 2022-05-22 VITALS — BP 118/70 | HR 69 | Temp 97.7°F | Resp 18 | Ht 74.0 in | Wt 223.2 lb

## 2022-05-22 DIAGNOSIS — D500 Iron deficiency anemia secondary to blood loss (chronic): Secondary | ICD-10-CM

## 2022-05-22 DIAGNOSIS — Z79899 Other long term (current) drug therapy: Secondary | ICD-10-CM | POA: Diagnosis not present

## 2022-05-22 DIAGNOSIS — R5383 Other fatigue: Secondary | ICD-10-CM | POA: Diagnosis not present

## 2022-05-22 DIAGNOSIS — R194 Change in bowel habit: Secondary | ICD-10-CM | POA: Diagnosis not present

## 2022-05-22 DIAGNOSIS — K922 Gastrointestinal hemorrhage, unspecified: Secondary | ICD-10-CM | POA: Diagnosis not present

## 2022-05-22 DIAGNOSIS — R0600 Dyspnea, unspecified: Secondary | ICD-10-CM | POA: Diagnosis not present

## 2022-05-22 DIAGNOSIS — R531 Weakness: Secondary | ICD-10-CM | POA: Diagnosis not present

## 2022-05-22 DIAGNOSIS — Z8719 Personal history of other diseases of the digestive system: Secondary | ICD-10-CM | POA: Diagnosis not present

## 2022-05-22 DIAGNOSIS — K6289 Other specified diseases of anus and rectum: Secondary | ICD-10-CM | POA: Diagnosis not present

## 2022-05-22 MED ORDER — SODIUM CHLORIDE 0.9 % IV SOLN: Freq: Once | INTRAVENOUS | Status: AC

## 2022-05-22 MED ORDER — SODIUM CHLORIDE 0.9 % IV SOLN
200.0000 mg | Freq: Once | INTRAVENOUS | Status: AC
  Administered 2022-05-22: 200 mg via INTRAVENOUS
  Filled 2022-05-22: qty 200

## 2022-05-22 NOTE — Patient Instructions (Signed)
Iron-Rich Diet  Iron is a mineral that helps your body produce hemoglobin. Hemoglobin is a protein in red blood cells that carries oxygen to your body's tissues. Eating too little iron may cause you to feel weak and tired, and it can increase your risk of infection. Iron is naturally found in many foods, and many foods have iron added to them (are iron-fortified). You may need to follow an iron-rich diet if you do not have enough iron in your body due to certain medical conditions. The amount of iron that you need each day depends on your age, your sex, and any medical conditions you have. Follow instructions from your health care provider or a dietitian about how much iron you should eat each day. What are tips for following this plan? Reading food labels Check food labels to see how many milligrams (mg) of iron are in each serving. Cooking Cook foods in pots and pans that are made from iron. Take these steps to make it easier for your body to absorb iron from certain foods: Soak beans overnight before cooking. Soak whole grains overnight and drain them before using. Ferment flours before baking, such as by using yeast in bread dough. Meal planning When you eat foods that contain iron, you should eat them with foods that are high in vitamin C. These include oranges, peppers, tomatoes, potatoes, and mangoes. Vitamin C helps your body absorb iron. Certain foods and drinks prevent your body from absorbing iron properly. Avoid eating these foods in the same meal as iron-rich foods or with iron supplements. These foods include: Coffee, black tea, and red wine. Milk, dairy products, and foods that are high in calcium. Beans and soybeans. Whole grains. General information Take iron supplements only as told by your health care provider. An overdose of iron can be life-threatening. If you were prescribed iron supplements, take them with orange juice or a vitamin C supplement. When you eat  iron-fortified foods or take an iron supplement, you should also eat foods that naturally contain iron, such as meat, poultry, and fish. Eating naturally iron-rich foods helps your body absorb the iron that is added to other foods or contained in a supplement. Iron from animal sources is better absorbed than iron from plant sources. What foods should I eat? Fruits Prunes. Raisins. Eat fruits high in vitamin C, such as oranges, grapefruits, and strawberries, with iron-rich foods. Vegetables Spinach (cooked). Green peas. Broccoli. Fermented vegetables. Eat vegetables high in vitamin C, such as leafy greens, potatoes, bell peppers, and tomatoes, with iron-rich foods. Grains Iron-fortified breakfast cereal. Iron-fortified whole-wheat bread. Enriched rice. Sprouted grains. Meats and other proteins Beef liver. Beef. Turkey. Chicken. Oysters. Shrimp. Tuna. Sardines. Chickpeas. Nuts. Tofu. Pumpkin seeds. Beverages Tomato juice. Fresh orange juice. Prune juice. Hibiscus tea. Iron-fortified instant breakfast shakes. Sweets and desserts Blackstrap molasses. Seasonings and condiments Tahini. Fermented soy sauce. Other foods Wheat germ. The items listed above may not be a complete list of recommended foods and beverages. Contact a dietitian for more information. What foods should I limit? These are foods that should be limited while eating iron-rich foods as they can reduce the absorption of iron in your body. Grains Whole grains. Bran cereal. Bran flour. Meats and other proteins Soybeans. Products made from soy protein. Black beans. Lentils. Mung beans. Split peas. Dairy Milk. Cream. Cheese. Yogurt. Cottage cheese. Beverages Coffee. Black tea. Red wine. Sweets and desserts Cocoa. Chocolate. Ice cream. Seasonings and condiments Basil. Oregano. Large amounts of parsley. The items listed   above may not be a complete list of foods and beverages you should limit. Contact a dietitian for more  information. Summary Iron is a mineral that helps your body produce hemoglobin. Hemoglobin is a protein in red blood cells that carries oxygen to your body's tissues. Iron is naturally found in many foods, and many foods have iron added to them (are iron-fortified). When you eat foods that contain iron, you should eat them with foods that are high in vitamin C. Vitamin C helps your body absorb iron. Certain foods and drinks prevent your body from absorbing iron properly, such as whole grains and dairy products. You should avoid eating these foods in the same meal as iron-rich foods or with iron supplements. This information is not intended to replace advice given to you by your health care provider. Make sure you discuss any questions you have with your health care provider. Document Revised: 06/27/2020 Document Reviewed: 06/27/2020 Elsevier Patient Education  2023 [employer] Inc.  

## 2022-05-23 MED FILL — Iron Sucrose Inj 20 MG/ML (Fe Equiv): INTRAVENOUS | Qty: 10 | Status: AC

## 2022-05-24 ENCOUNTER — Inpatient Hospital Stay: Payer: 59

## 2022-05-24 VITALS — BP 118/82 | HR 73 | Temp 97.4°F | Resp 18 | Ht 74.0 in

## 2022-05-24 DIAGNOSIS — Z8719 Personal history of other diseases of the digestive system: Secondary | ICD-10-CM | POA: Diagnosis not present

## 2022-05-24 DIAGNOSIS — R194 Change in bowel habit: Secondary | ICD-10-CM | POA: Diagnosis not present

## 2022-05-24 DIAGNOSIS — Z79899 Other long term (current) drug therapy: Secondary | ICD-10-CM | POA: Diagnosis not present

## 2022-05-24 DIAGNOSIS — D500 Iron deficiency anemia secondary to blood loss (chronic): Secondary | ICD-10-CM

## 2022-05-24 DIAGNOSIS — R531 Weakness: Secondary | ICD-10-CM | POA: Diagnosis not present

## 2022-05-24 DIAGNOSIS — R5383 Other fatigue: Secondary | ICD-10-CM | POA: Diagnosis not present

## 2022-05-24 DIAGNOSIS — K922 Gastrointestinal hemorrhage, unspecified: Secondary | ICD-10-CM | POA: Diagnosis not present

## 2022-05-24 DIAGNOSIS — R0600 Dyspnea, unspecified: Secondary | ICD-10-CM | POA: Diagnosis not present

## 2022-05-24 DIAGNOSIS — K6289 Other specified diseases of anus and rectum: Secondary | ICD-10-CM | POA: Diagnosis not present

## 2022-05-24 MED ORDER — SODIUM CHLORIDE 0.9 % IV SOLN
200.0000 mg | Freq: Once | INTRAVENOUS | Status: AC
  Administered 2022-05-24: 200 mg via INTRAVENOUS
  Filled 2022-05-24: qty 200

## 2022-05-24 MED ORDER — SODIUM CHLORIDE 0.9 % IV SOLN: Freq: Once | INTRAVENOUS | Status: AC

## 2022-05-24 NOTE — Patient Instructions (Signed)
Iron Deficiency Anemia, Adult  Iron deficiency anemia is a condition in which the concentration of red blood cells or hemoglobin in the blood is below normal because of too little iron. Hemoglobin is a substance in red blood cells that carries oxygen to the body's tissues. When the concentration of red blood cells or hemoglobin is too low, not enough oxygen reaches these tissues. Iron deficiency anemia is usually long-lasting, and it develops over time. It may or may not cause symptoms. It is a common type of anemia. What are the causes? This condition may be caused by: Not enough iron in the diet. Abnormal absorption in the gut. Blood loss. What increases the risk? You are more likely to develop this condition if you get menstrual periods (menstruate) or are pregnant. What are the signs or symptoms? Symptoms of this condition may include: Pale skin, lips, and nail beds. Weakness, dizziness, and getting tired easily. Shortness of breath when moving or exercising. Cold hands or feet. Mild anemia may not cause any symptoms. How is this diagnosed? This condition is diagnosed based on: Your medical history. A physical exam. Blood tests. How is this treated? This condition is treated by correcting the cause of your iron deficiency. Treatment may involve: Adding iron-rich foods to your diet. Taking iron supplements. If you are pregnant or breastfeeding, you may need to take extra iron because your normal diet usually does not provide the amount of iron that you need. Increasing vitamin C intake. Vitamin C helps your body absorb iron. Your health care provider may recommend that you take iron supplements along with a glass of orange juice or a vitamin C supplement. Medicines to make heavy menstrual flow lighter. Surgery or additional testing procedures to determine the cause of your anemia. You may need repeat blood tests to determine whether treatment is working. If the treatment does not  seem to be working, you may need more tests. Follow these instructions at home: Medicines Take over-the-counter and prescription medicines only as told by your health care provider. This includes iron supplements and vitamins. This is important because too much iron can be harmful. For the best iron absorption, you should take iron supplements when your stomach is empty. If you cannot tolerate them on an empty stomach, you may need to take them with food. Do not drink milk or take antacids at the same time as your iron supplements. Milk and antacids may interfere with how your body absorbs iron. Iron supplements may turn stool (feces) a darker color and it may appear black. If you cannot tolerate taking iron supplements by mouth, talk with your health care provider about taking them through an IV or through an injection into a muscle. Eating and drinking Talk with your health care provider before changing your diet. Your provider may recommend that you eat foods that contain a lot of iron, such as: Liver. Low-fat (lean) beef. Breads and cereals that have iron added to them (are fortified). Eggs. Dried fruit. Dark green, leafy vegetables. To help your body use the iron from iron-rich foods, eat those foods at the same time as fresh fruits and vegetables that are high in vitamin C. Foods that are high in vitamin C include: Oranges. Peppers. Tomatoes. Mangoes. Managing constipation If you are taking an iron supplement, it may cause constipation. To prevent or treat constipation, you may need to: Drink enough fluid to keep your urine pale yellow. Take over-the-counter or prescription medicines. Eat foods that are high in fiber, such   as beans, whole grains, and fresh fruits and vegetables. Limit foods that are high in fat and processed sugars, such as fried or sweet foods. General instructions Return to your normal activities as told by your health care provider. Ask your health care provider  what activities are safe for you. Keep all follow-up visits. Contact a health care provider if: You feel nauseous or you vomit. You feel weak. You become light-headed when getting up from a sitting or lying down position. You have unexplained sweating. You develop symptoms of constipation. You have a heaviness in your chest. You have trouble breathing with physical activity. Get help right away if: You faint. If this happens, do not drive yourself to the hospital. You have an irregular or rapid heartbeat. Summary Iron deficiency anemia is a condition in which the concentration of red blood cells or hemoglobin in the blood is below normal because of too little iron. This condition is treated by correcting the cause of your iron deficiency. Take over-the-counter and prescription medicines only as told by your health care provider. This includes iron supplements and vitamins. To help your body use the iron from iron-rich foods, eat those foods at the same time as fresh fruits and vegetables that are high in vitamin C. Seek medical help if you have signs or symptoms of worsening anemia. This information is not intended to replace advice given to you by your health care provider. Make sure you discuss any questions you have with your health care provider. Document Revised: 08/23/2021 Document Reviewed: 08/23/2021 Elsevier Patient Education  2023 Elsevier Inc.  

## 2022-05-25 MED FILL — Iron Sucrose Inj 20 MG/ML (Fe Equiv): INTRAVENOUS | Qty: 10 | Status: AC

## 2022-05-28 ENCOUNTER — Inpatient Hospital Stay: Payer: 59

## 2022-05-28 VITALS — BP 130/68 | HR 73 | Temp 97.7°F | Resp 20 | Ht 74.0 in | Wt 225.8 lb

## 2022-05-28 DIAGNOSIS — Z79899 Other long term (current) drug therapy: Secondary | ICD-10-CM | POA: Diagnosis not present

## 2022-05-28 DIAGNOSIS — D500 Iron deficiency anemia secondary to blood loss (chronic): Secondary | ICD-10-CM | POA: Diagnosis not present

## 2022-05-28 DIAGNOSIS — R0600 Dyspnea, unspecified: Secondary | ICD-10-CM | POA: Diagnosis not present

## 2022-05-28 DIAGNOSIS — K922 Gastrointestinal hemorrhage, unspecified: Secondary | ICD-10-CM | POA: Diagnosis not present

## 2022-05-28 DIAGNOSIS — Z8719 Personal history of other diseases of the digestive system: Secondary | ICD-10-CM | POA: Diagnosis not present

## 2022-05-28 DIAGNOSIS — R194 Change in bowel habit: Secondary | ICD-10-CM | POA: Diagnosis not present

## 2022-05-28 DIAGNOSIS — R5383 Other fatigue: Secondary | ICD-10-CM | POA: Diagnosis not present

## 2022-05-28 DIAGNOSIS — R531 Weakness: Secondary | ICD-10-CM | POA: Diagnosis not present

## 2022-05-28 DIAGNOSIS — K6289 Other specified diseases of anus and rectum: Secondary | ICD-10-CM | POA: Diagnosis not present

## 2022-05-28 MED ORDER — SODIUM CHLORIDE 0.9 % IV SOLN: Freq: Once | INTRAVENOUS | Status: AC

## 2022-05-28 MED ORDER — SODIUM CHLORIDE 0.9 % IV SOLN
200.0000 mg | Freq: Once | INTRAVENOUS | Status: AC
  Administered 2022-05-28: 200 mg via INTRAVENOUS
  Filled 2022-05-28: qty 200

## 2022-05-28 NOTE — Patient Instructions (Signed)
Iron Deficiency Anemia, Adult  Iron deficiency anemia is a condition in which the concentration of red blood cells or hemoglobin in the blood is below normal because of too little iron. Hemoglobin is a substance in red blood cells that carries oxygen to the body's tissues. When the concentration of red blood cells or hemoglobin is too low, not enough oxygen reaches these tissues. Iron deficiency anemia is usually long-lasting, and it develops over time. It may or may not cause symptoms. It is a common type of anemia. What are the causes? This condition may be caused by: Not enough iron in the diet. Abnormal absorption in the gut. Blood loss. What increases the risk? You are more likely to develop this condition if you get menstrual periods (menstruate) or are pregnant. What are the signs or symptoms? Symptoms of this condition may include: Pale skin, lips, and nail beds. Weakness, dizziness, and getting tired easily. Shortness of breath when moving or exercising. Cold hands or feet. Mild anemia may not cause any symptoms. How is this diagnosed? This condition is diagnosed based on: Your medical history. A physical exam. Blood tests. How is this treated? This condition is treated by correcting the cause of your iron deficiency. Treatment may involve: Adding iron-rich foods to your diet. Taking iron supplements. If you are pregnant or breastfeeding, you may need to take extra iron because your normal diet usually does not provide the amount of iron that you need. Increasing vitamin C intake. Vitamin C helps your body absorb iron. Your health care provider may recommend that you take iron supplements along with a glass of orange juice or a vitamin C supplement. Medicines to make heavy menstrual flow lighter. Surgery or additional testing procedures to determine the cause of your anemia. You may need repeat blood tests to determine whether treatment is working. If the treatment does not  seem to be working, you may need more tests. Follow these instructions at home: Medicines Take over-the-counter and prescription medicines only as told by your health care provider. This includes iron supplements and vitamins. This is important because too much iron can be harmful. For the best iron absorption, you should take iron supplements when your stomach is empty. If you cannot tolerate them on an empty stomach, you may need to take them with food. Do not drink milk or take antacids at the same time as your iron supplements. Milk and antacids may interfere with how your body absorbs iron. Iron supplements may turn stool (feces) a darker color and it may appear black. If you cannot tolerate taking iron supplements by mouth, talk with your health care provider about taking them through an IV or through an injection into a muscle. Eating and drinking Talk with your health care provider before changing your diet. Your provider may recommend that you eat foods that contain a lot of iron, such as: Liver. Low-fat (lean) beef. Breads and cereals that have iron added to them (are fortified). Eggs. Dried fruit. Dark green, leafy vegetables. To help your body use the iron from iron-rich foods, eat those foods at the same time as fresh fruits and vegetables that are high in vitamin C. Foods that are high in vitamin C include: Oranges. Peppers. Tomatoes. Mangoes. Managing constipation If you are taking an iron supplement, it may cause constipation. To prevent or treat constipation, you may need to: Drink enough fluid to keep your urine pale yellow. Take over-the-counter or prescription medicines. Eat foods that are high in fiber, such   as beans, whole grains, and fresh fruits and vegetables. Limit foods that are high in fat and processed sugars, such as fried or sweet foods. General instructions Return to your normal activities as told by your health care provider. Ask your health care provider  what activities are safe for you. Keep all follow-up visits. Contact a health care provider if: You feel nauseous or you vomit. You feel weak. You become light-headed when getting up from a sitting or lying down position. You have unexplained sweating. You develop symptoms of constipation. You have a heaviness in your chest. You have trouble breathing with physical activity. Get help right away if: You faint. If this happens, do not drive yourself to the hospital. You have an irregular or rapid heartbeat. Summary Iron deficiency anemia is a condition in which the concentration of red blood cells or hemoglobin in the blood is below normal because of too little iron. This condition is treated by correcting the cause of your iron deficiency. Take over-the-counter and prescription medicines only as told by your health care provider. This includes iron supplements and vitamins. To help your body use the iron from iron-rich foods, eat those foods at the same time as fresh fruits and vegetables that are high in vitamin C. Seek medical help if you have signs or symptoms of worsening anemia. This information is not intended to replace advice given to you by your health care provider. Make sure you discuss any questions you have with your health care provider. Document Revised: 08/23/2021 Document Reviewed: 08/23/2021 Elsevier Patient Education  2023 Elsevier Inc.  

## 2022-05-29 MED FILL — Iron Sucrose Inj 20 MG/ML (Fe Equiv): INTRAVENOUS | Qty: 10 | Status: AC

## 2022-05-30 ENCOUNTER — Inpatient Hospital Stay: Payer: 59 | Attending: Physician Assistant

## 2022-05-30 VITALS — BP 141/65 | HR 54 | Temp 97.9°F | Resp 18 | Ht 74.0 in | Wt 222.1 lb

## 2022-05-30 DIAGNOSIS — Z79899 Other long term (current) drug therapy: Secondary | ICD-10-CM | POA: Diagnosis not present

## 2022-05-30 DIAGNOSIS — K922 Gastrointestinal hemorrhage, unspecified: Secondary | ICD-10-CM | POA: Insufficient documentation

## 2022-05-30 DIAGNOSIS — D500 Iron deficiency anemia secondary to blood loss (chronic): Secondary | ICD-10-CM | POA: Insufficient documentation

## 2022-05-30 MED ORDER — SODIUM CHLORIDE 0.9 % IV SOLN: Freq: Once | INTRAVENOUS | Status: AC

## 2022-05-30 MED ORDER — SODIUM CHLORIDE 0.9 % IV SOLN
200.0000 mg | Freq: Once | INTRAVENOUS | Status: AC
  Administered 2022-05-30: 200 mg via INTRAVENOUS
  Filled 2022-05-30: qty 200

## 2022-05-30 NOTE — Patient Instructions (Signed)
Iron Deficiency Anemia, Adult  Iron deficiency anemia is a condition in which the concentration of red blood cells or hemoglobin in the blood is below normal because of too little iron. Hemoglobin is a substance in red blood cells that carries oxygen to the body's tissues. When the concentration of red blood cells or hemoglobin is too low, not enough oxygen reaches these tissues. Iron deficiency anemia is usually long-lasting, and it develops over time. It may or may not cause symptoms. It is a common type of anemia. What are the causes? This condition may be caused by: Not enough iron in the diet. Abnormal absorption in the gut. Blood loss. What increases the risk? You are more likely to develop this condition if you get menstrual periods (menstruate) or are pregnant. What are the signs or symptoms? Symptoms of this condition may include: Pale skin, lips, and nail beds. Weakness, dizziness, and getting tired easily. Shortness of breath when moving or exercising. Cold hands or feet. Mild anemia may not cause any symptoms. How is this diagnosed? This condition is diagnosed based on: Your medical history. A physical exam. Blood tests. How is this treated? This condition is treated by correcting the cause of your iron deficiency. Treatment may involve: Adding iron-rich foods to your diet. Taking iron supplements. If you are pregnant or breastfeeding, you may need to take extra iron because your normal diet usually does not provide the amount of iron that you need. Increasing vitamin C intake. Vitamin C helps your body absorb iron. Your health care provider may recommend that you take iron supplements along with a glass of orange juice or a vitamin C supplement. Medicines to make heavy menstrual flow lighter. Surgery or additional testing procedures to determine the cause of your anemia. You may need repeat blood tests to determine whether treatment is working. If the treatment does not  seem to be working, you may need more tests. Follow these instructions at home: Medicines Take over-the-counter and prescription medicines only as told by your health care provider. This includes iron supplements and vitamins. This is important because too much iron can be harmful. For the best iron absorption, you should take iron supplements when your stomach is empty. If you cannot tolerate them on an empty stomach, you may need to take them with food. Do not drink milk or take antacids at the same time as your iron supplements. Milk and antacids may interfere with how your body absorbs iron. Iron supplements may turn stool (feces) a darker color and it may appear black. If you cannot tolerate taking iron supplements by mouth, talk with your health care provider about taking them through an IV or through an injection into a muscle. Eating and drinking Talk with your health care provider before changing your diet. Your provider may recommend that you eat foods that contain a lot of iron, such as: Liver. Low-fat (lean) beef. Breads and cereals that have iron added to them (are fortified). Eggs. Dried fruit. Dark green, leafy vegetables. To help your body use the iron from iron-rich foods, eat those foods at the same time as fresh fruits and vegetables that are high in vitamin C. Foods that are high in vitamin C include: Oranges. Peppers. Tomatoes. Mangoes. Managing constipation If you are taking an iron supplement, it may cause constipation. To prevent or treat constipation, you may need to: Drink enough fluid to keep your urine pale yellow. Take over-the-counter or prescription medicines. Eat foods that are high in fiber, such   as beans, whole grains, and fresh fruits and vegetables. Limit foods that are high in fat and processed sugars, such as fried or sweet foods. General instructions Return to your normal activities as told by your health care provider. Ask your health care provider  what activities are safe for you. Keep all follow-up visits. Contact a health care provider if: You feel nauseous or you vomit. You feel weak. You become light-headed when getting up from a sitting or lying down position. You have unexplained sweating. You develop symptoms of constipation. You have a heaviness in your chest. You have trouble breathing with physical activity. Get help right away if: You faint. If this happens, do not drive yourself to the hospital. You have an irregular or rapid heartbeat. Summary Iron deficiency anemia is a condition in which the concentration of red blood cells or hemoglobin in the blood is below normal because of too little iron. This condition is treated by correcting the cause of your iron deficiency. Take over-the-counter and prescription medicines only as told by your health care provider. This includes iron supplements and vitamins. To help your body use the iron from iron-rich foods, eat those foods at the same time as fresh fruits and vegetables that are high in vitamin C. Seek medical help if you have signs or symptoms of worsening anemia. This information is not intended to replace advice given to you by your health care provider. Make sure you discuss any questions you have with your health care provider. Document Revised: 08/23/2021 Document Reviewed: 08/23/2021 Elsevier Patient Education  2023 Elsevier Inc.  

## 2022-06-05 DIAGNOSIS — Z0000 Encounter for general adult medical examination without abnormal findings: Secondary | ICD-10-CM | POA: Diagnosis not present

## 2022-06-05 DIAGNOSIS — Z68.41 Body mass index (BMI) pediatric, 85th percentile to less than 95th percentile for age: Secondary | ICD-10-CM | POA: Diagnosis not present

## 2022-07-03 ENCOUNTER — Other Ambulatory Visit: Payer: Self-pay | Admitting: Hematology and Oncology

## 2022-07-03 DIAGNOSIS — D500 Iron deficiency anemia secondary to blood loss (chronic): Secondary | ICD-10-CM

## 2022-07-05 ENCOUNTER — Telehealth: Payer: Self-pay | Admitting: Hematology and Oncology

## 2022-07-05 ENCOUNTER — Telehealth: Payer: Self-pay

## 2022-07-05 ENCOUNTER — Inpatient Hospital Stay: Payer: 59

## 2022-07-05 ENCOUNTER — Inpatient Hospital Stay: Payer: 59 | Attending: Physician Assistant | Admitting: Hematology and Oncology

## 2022-07-05 ENCOUNTER — Encounter: Payer: Self-pay | Admitting: Hematology and Oncology

## 2022-07-05 VITALS — BP 124/69 | HR 67 | Temp 97.8°F | Resp 20 | Ht 74.0 in | Wt 219.3 lb

## 2022-07-05 DIAGNOSIS — Z79899 Other long term (current) drug therapy: Secondary | ICD-10-CM | POA: Diagnosis not present

## 2022-07-05 DIAGNOSIS — D500 Iron deficiency anemia secondary to blood loss (chronic): Secondary | ICD-10-CM | POA: Insufficient documentation

## 2022-07-05 DIAGNOSIS — Z800 Family history of malignant neoplasm of digestive organs: Secondary | ICD-10-CM | POA: Insufficient documentation

## 2022-07-05 DIAGNOSIS — Z8249 Family history of ischemic heart disease and other diseases of the circulatory system: Secondary | ICD-10-CM | POA: Diagnosis not present

## 2022-07-05 DIAGNOSIS — Z8379 Family history of other diseases of the digestive system: Secondary | ICD-10-CM | POA: Diagnosis not present

## 2022-07-05 DIAGNOSIS — Z8051 Family history of malignant neoplasm of kidney: Secondary | ICD-10-CM | POA: Insufficient documentation

## 2022-07-05 DIAGNOSIS — K922 Gastrointestinal hemorrhage, unspecified: Secondary | ICD-10-CM | POA: Insufficient documentation

## 2022-07-05 DIAGNOSIS — Z83719 Family history of colon polyps, unspecified: Secondary | ICD-10-CM | POA: Diagnosis not present

## 2022-07-05 DIAGNOSIS — Z833 Family history of diabetes mellitus: Secondary | ICD-10-CM | POA: Insufficient documentation

## 2022-07-05 DIAGNOSIS — Z841 Family history of disorders of kidney and ureter: Secondary | ICD-10-CM | POA: Insufficient documentation

## 2022-07-05 DIAGNOSIS — Z803 Family history of malignant neoplasm of breast: Secondary | ICD-10-CM | POA: Diagnosis not present

## 2022-07-05 DIAGNOSIS — Z8719 Personal history of other diseases of the digestive system: Secondary | ICD-10-CM | POA: Insufficient documentation

## 2022-07-05 DIAGNOSIS — Z808 Family history of malignant neoplasm of other organs or systems: Secondary | ICD-10-CM | POA: Diagnosis not present

## 2022-07-05 LAB — CBC WITH DIFFERENTIAL (CANCER CENTER ONLY)
Abs Immature Granulocytes: 0.02 10*3/uL (ref 0.00–0.07)
Basophils Absolute: 0.1 10*3/uL (ref 0.0–0.1)
Basophils Relative: 1 %
Eosinophils Absolute: 0.1 10*3/uL (ref 0.0–0.5)
Eosinophils Relative: 2 %
HCT: 47.6 % (ref 39.0–52.0)
Hemoglobin: 15.3 g/dL (ref 13.0–17.0)
Immature Granulocytes: 0 %
Lymphocytes Relative: 20 %
Lymphs Abs: 1.1 10*3/uL (ref 0.7–4.0)
MCH: 29.1 pg (ref 26.0–34.0)
MCHC: 32.1 g/dL (ref 30.0–36.0)
MCV: 90.7 fL (ref 80.0–100.0)
Monocytes Absolute: 0.6 10*3/uL (ref 0.1–1.0)
Monocytes Relative: 11 %
Neutro Abs: 3.8 10*3/uL (ref 1.7–7.7)
Neutrophils Relative %: 66 %
Platelet Count: 266 10*3/uL (ref 150–400)
RBC: 5.25 MIL/uL (ref 4.22–5.81)
RDW: 16.5 % — ABNORMAL HIGH (ref 11.5–15.5)
WBC Count: 5.8 10*3/uL (ref 4.0–10.5)
nRBC: 0 % (ref 0.0–0.2)

## 2022-07-05 LAB — IRON AND TIBC
Iron: 112 ug/dL (ref 45–182)
Saturation Ratios: 27 % (ref 17.9–39.5)
TIBC: 416 ug/dL (ref 250–450)
UIBC: 304 ug/dL

## 2022-07-05 LAB — FERRITIN: Ferritin: 58 ng/mL (ref 24–336)

## 2022-07-05 NOTE — Progress Notes (Signed)
[facility]  [address] [place],  [place]  [address] [phone]  Clinic Day:  07/05/2022  Referring physician: Nicholes Mango, MD  ASSESSMENT & PLAN:   Assessment & Plan: Anemia due to GI blood loss Iron deficiency due to large bleeding polyp, which has been removed.  He denies any overt form of blood loss.  His hemoglobin and iron studies have returned to normal after receiving IV Venofer.  He has been taking iron supplement sporadically, so I will have him discontinue that for now.  I will plan to see him back in 3 months for repeat clinical assessment.  Family history of colon cancer Paternal family history of colon cancer, as well as multiple malignancies on both sides of the family.  He has seen a Dietitian and testing for cancer syndromes was recommended, but he declined.  We once again discussed the pros and cons of genetic testing.  He still does not wish to proceed.  His mother accompanies him today and is interested in undergoing testing, so I will arrange for that.    The patient understands the plans discussed today and is in agreement with them.  He knows to contact our office if he develops concerns prior to his next appointment.   I provided 25 minutes of face-to-face time during this encounter and > 50% was spent counseling as documented under my assessment and plan.    Marvia Pickles, PA-C  [facility] [address] [place] [address] Dept: [phone] Dept Fax: [phone]   No orders of the defined types were placed in this encounter.     CHIEF COMPLAINT:  CC: Iron deficiency anemia  Current Treatment: IV Venofer  HISTORY OF PRESENT ILLNESS:  Glenn Walter is an 21 year old with iron deficiency anemia diagnosed in January due to GI bleeding from a large rectal polyp.   He is transferring care here as he lives in [place].  He was seen in  October by Dede Query, PA-C at [facility] for further management.  He had persistent iron deficiency despite oral iron supplementation.  He received IV Venofer in October. He was admitted in January with progressive weakness, fatigue, intermittent dyspnea and dark/black stools.  He was found to have hemoglobin of 3.9, iron 10, iron saturation 2%, ferritin 3.  FOBT was positive.  He received 5 units of PRBC.  EGD revealed gastritis, biopsy taken which was negative for H. pylori or any malignancy. Colonoscopy revealed a large rectal polyp of about 4 cm.  Biopsy was consistent with tubular adenoma with high-grade dysplasia.Marland Kitchen  CEA was normal.  CT chest, abdomen and pelvis was negative for malignancy.  He was started on oral iron therapy.  In March, he underwent partial proctectomy of the rectal mass with Dr. Michael Boston.  Pathology revealed a 5.1 cm tubular adenoma with high-grade dysplasia.  Margins were negative.  There was no evidence of invasive carcinoma.  Due to his family history of malignancy, he saw the genetic counselor.  Testing for hereditary cancer syndromes was recommended, but the patient declined.  INTERVAL HISTORY:  Glenn Walter is here today for repeat clinical assessment after receiving IV Venofer.  He states he is feeling quite well since receiving the IV iron.  He denies any overt form of blood loss including melena or bright red blood per rectum. He denies fevers or chills. He denies pain. His appetite is good.  His weight has decreased 3 pounds over last month .  REVIEW OF SYSTEMS:  Review of Systems  Constitutional:  Negative for appetite change, chills, fatigue, fever and unexpected weight change.  HENT:   Negative for lump/mass, mouth sores and sore throat.   Respiratory:  Negative for cough and shortness of breath.   Cardiovascular:  Negative for chest pain and leg swelling.  Gastrointestinal:  Negative for abdominal pain, constipation, diarrhea, nausea and  vomiting.  Genitourinary:  Negative for difficulty urinating, dysuria, frequency and hematuria.   Musculoskeletal:  Negative for arthralgias, back pain and myalgias.  Skin:  Negative for itching, rash and wound.  Neurological:  Negative for dizziness, extremity weakness, headaches, light-headedness and numbness.  Hematological:  Negative for adenopathy.  Psychiatric/Behavioral:  Negative for depression and sleep disturbance. The patient is not nervous/anxious.      VITALS:  Blood pressure 124/69, pulse 67, temperature 97.8 F (36.6 C), temperature source Oral, resp. rate 20, height _0  (1.88 m), weight 219 lb 4.8 oz (99.5 kg), SpO2 100 %.  Wt Readings from Last 3 Encounters:  07/05/22 219 lb 4.8 oz (99.5 kg) (97 %, Z= 1.90)*  05/30/22 222 lb 1.9 oz (100.8 kg) (97 %, Z= 1.96)*  05/28/22 225 lb 12 oz (102.4 kg) (98 %, Z= 2.03)*   * Growth percentiles are based on CDC (Boys, 2-20 Years) data.    Body mass index is 28.16 kg/m.  Performance status (ECOG): 0 - Asymptomatic  PHYSICAL EXAM:  Physical Exam Vitals and nursing note reviewed.  Constitutional:      General: He is not in acute distress.    Appearance: Normal appearance. He is normal weight.  HENT:     Head: Normocephalic and atraumatic.     Mouth/Throat:     Mouth: Mucous membranes are moist.     Pharynx: Oropharynx is clear. No oropharyngeal exudate or posterior oropharyngeal erythema.  Eyes:     General: No scleral icterus.    Extraocular Movements: Extraocular movements intact.     Conjunctiva/sclera: Conjunctivae normal.     Pupils: Pupils are equal, round, and reactive to light.  Cardiovascular:     Rate and Rhythm: Normal rate and regular rhythm.     Heart sounds: Normal heart sounds. No murmur heard.    No friction rub. No gallop.  Pulmonary:     Effort: Pulmonary effort is normal.     Breath sounds: Normal breath sounds. No wheezing, rhonchi or rales.  Abdominal:     General: Bowel sounds are normal. There  is no distension.     Palpations: Abdomen is soft. There is no mass.     Tenderness: There is no abdominal tenderness.  Musculoskeletal:        General: Normal range of motion.     Cervical back: Normal range of motion and neck supple. No tenderness.     Right lower leg: No edema.     Left lower leg: No edema.  Lymphadenopathy:     Cervical: No cervical adenopathy.  Skin:    General: Skin is warm and dry.     Coloration: Skin is not jaundiced.     Findings: No rash.  Neurological:     Mental Status: He is alert and oriented to person, place, and time.     Cranial Nerves: No cranial nerve deficit.  Psychiatric:        Mood and Affect: Mood normal.        Behavior: Behavior normal.  Thought Content: Thought content normal.     LABS:      Latest Ref Rng & Units 07/05/2022    8:48 AM 05/09/2022    1:36 PM 03/28/2022    9:32 AM  CBC  WBC 4.0 - 10.5 K/uL 5.8  6.5  5.6   Hemoglobin 13.0 - 17.0 g/dL 15.3  13.5  12.6   Hematocrit 39.0 - 52.0 % 47.6  41.8  39.7   Platelets 150 - 400 K/uL 266  293  273.0       Latest Ref Rng & Units 05/09/2022    1:36 PM 08/20/2021   10:05 AM 08/19/2021    3:59 PM  CMP  Glucose 70 - 99 mg/dL 87  89  95   BUN 6 - 20 mg/dL _0 Creatinine 0.61 - 1.24 mg/dL 0.72  0.90  0.83   Sodium 135 - 145 mmol/L 139  140  137   Potassium 3.5 - 5.1 mmol/L 4.1  3.9  3.8   Chloride 98 - 111 mmol/L 105  103  104   CO2 22 - 32 mmol/L 26   22   Calcium 8.9 - 10.3 mg/dL 9.2   9.1   Total Protein 6.5 - 8.1 g/dL 8.0   6.8   Total Bilirubin 0.3 - 1.2 mg/dL 1.6   0.9   Alkaline Phos 38 - 126 U/L 79   75   AST 15 - 41 U/L 21   18   ALT 0 - 44 U/L 20   10      Lab Results  Component Value Date   CEA1 <0.6 08/21/2021   /  CEA  Date Value Ref Range Status  08/21/2021 <0.6 0.0 - 4.7 ng/mL Final    Comment:    (NOTE)                             Nonsmokers          <3.9                             Smokers             <5.6 [employer]  Electrochemiluminescence Immunoassay (ECLIA) Values obtained with different assay methods or kits cannot be used interchangeably.  Results cannot be interpreted as absolute evidence of the presence or absence of malignant disease. Performed At: [facility] [place], [place] [id number] Rush Farmer MD [id number]    No results found for: "PSA1" No results found for: "740 [id number]" No results found for: "CAN125"  No results found for: "TOTALPROTELP", "ALBUMINELP", "A1GS", "A2GS", "BETS", "BETA2SER", "GAMS", "MSPIKE", "SPEI" Lab Results  Component Value Date   TIBC 416 07/05/2022   TIBC 561 (H) 05/09/2022   TIBC 539.0 (H) 03/28/2022   FERRITIN 58 07/05/2022   FERRITIN 6 (L) 05/09/2022   FERRITIN 4.9 (L) 03/28/2022   IRONPCTSAT 27 07/05/2022   IRONPCTSAT 31 05/09/2022   IRONPCTSAT 11.1 (L) 03/28/2022   No results found for: "LDH"  STUDIES:  No results found.    HISTORY:   Past Medical History:  Diagnosis Date   Anemia    Family history of breast cancer    Family history of colon cancer    Family history of pancreatic cancer    Hemorrhoids    Rectal mass     Past Surgical History:  Procedure Laterality  Date   ANTERIOR CRUCIATE LIGAMENT REPAIR Left 07/04/2021   Procedure: LEFT KNEE ANTERIOR CRUCIATE LIGAMENT RECONSTRUCTION WITH QUADRICEP AUTOGRAFT AND MEDIAL MENISCAL REPAIR VERSUS DEBRIDEMENT;  Surgeon: Meredith Pel, MD;  Location: [facility];  Service: Orthopedics;  Laterality: Left;   BIOPSY  08/20/2021   Procedure: BIOPSY;  Surgeon: Carol Ada, MD;  Location: [facility] ENDOSCOPY;  Service: Endoscopy;;   BIOPSY  08/21/2021   Procedure: BIOPSY;  Surgeon: Thornton Park, MD;  Location: [facility];  Service: Gastroenterology;;   COLONOSCOPY WITH PROPOFOL N/A 08/21/2021   Procedure: COLONOSCOPY WITH PROPOFOL;  Surgeon: Thornton Park, MD;  Location: [facility];  Service: Gastroenterology;  Laterality: N/A;   ESOPHAGOGASTRODUODENOSCOPY (EGD) WITH  PROPOFOL N/A 08/20/2021   Procedure: ESOPHAGOGASTRODUODENOSCOPY (EGD) WITH PROPOFOL;  Surgeon: Carol Ada, MD;  Location: [facility];  Service: Endoscopy;  Laterality: N/A;   PARTIAL PROCTECTOMY BY TEM N/A 10/05/2021   Procedure: TEM PARTIAL PORCTECTOMY OF RECTAL MASS, PARTIAL RESECTION OF RECTAL MASS, PARTIAL PROCTECTOMY;  Surgeon: Michael Boston, MD;  Location: [facility];  Service: General;  Laterality: N/A;    Family History  Problem Relation Age of Onset   Diabetes Mother    Hypertension Father    Hyperlipidemia Father    Breast cancer Maternal Grandmother 25   Liver disease Maternal Grandmother    Diabetes Maternal Grandfather    Kidney disease Maternal Grandfather    Multiple myeloma Paternal Grandmother 45   Breast cancer Paternal Grandmother    Kidney cancer Paternal Grandfather 45   Heart disease Paternal Grandfather    Colon polyps Paternal Uncle    Breast cancer Other        MGA,  PGA   Colon cancer Other        MGU, MGGA x 2   Pancreatic cancer Other        PGA   Breast cancer Paternal Aunt    Pancreatic cancer Maternal Great-grandmother     Social History:  reports that he has never smoked. He has never used smokeless tobacco. He reports that he does not drink alcohol and does not use drugs.The patient is accompanied by his mother today.  Allergies: No Known Allergies  Current Medications: Current Outpatient Medications  Medication Sig Dispense Refill   ferrous sulfate 325 (65 FE) MG tablet Take 1 tablet (325 mg total) by mouth 2 (two) times daily with a meal. (Patient taking differently: Take 325 mg by mouth daily.) 60 tablet 2   No current facility-administered medications for this visit.

## 2022-07-05 NOTE — Telephone Encounter (Signed)
-----   Message from Marvia Pickles, PA-C sent at 07/05/2022  1:30 PM EST ----- Please let him know his hemoglobin and iron tests are normal. Can discontinue oral iron tablet. Call if he feels like he may be getting anemic again. Thanks

## 2022-07-05 NOTE — Assessment & Plan Note (Signed)
Paternal family history of colon cancer, as well as multiple malignancies on both sides of the family.  He has seen a Dietitian and testing for cancer syndromes was recommended, but he declined.  We once again discussed the pros and cons of genetic testing.  He still does not wish to proceed.  His mother accompanies him today and is interested in undergoing testing, so I will arrange for that.

## 2022-07-05 NOTE — Telephone Encounter (Signed)
07/05/22 Next appt scheduled and confirmed with patient

## 2022-07-05 NOTE — Telephone Encounter (Signed)
Patient notified and voiced understanding.

## 2022-07-05 NOTE — Assessment & Plan Note (Signed)
Iron deficiency due to large bleeding polyp, which has been removed.  He denies any overt form of blood loss.  His hemoglobin and iron studies have returned to normal after receiving IV Venofer.  He has been taking iron supplement sporadically, so I will have him discontinue that for now.  I will plan to see him back in 3 months for repeat clinical assessment.

## 2022-07-12 ENCOUNTER — Encounter: Payer: Self-pay | Admitting: Hematology and Oncology

## 2022-07-14 ENCOUNTER — Encounter: Payer: Self-pay | Admitting: Hematology and Oncology

## 2022-08-07 ENCOUNTER — Other Ambulatory Visit (HOSPITAL_COMMUNITY): Payer: Self-pay

## 2022-09-27 ENCOUNTER — Other Ambulatory Visit: Payer: Self-pay | Admitting: Hematology and Oncology

## 2022-09-27 DIAGNOSIS — D500 Iron deficiency anemia secondary to blood loss (chronic): Secondary | ICD-10-CM

## 2022-10-04 ENCOUNTER — Other Ambulatory Visit: Payer: Self-pay

## 2022-10-04 ENCOUNTER — Ambulatory Visit: Payer: Self-pay | Admitting: Oncology

## 2022-10-04 NOTE — Progress Notes (Deleted)
[place] Initial Visit:  Patient Care Team: Mateo Flow, MD as PCP - General (Family Medicine) Michael Boston, MD as Consulting Physician (General Surgery) Thornton Park, MD as Consulting Physician (Gastroenterology) Carol Ada, MD as Consulting Physician (Gastroenterology)  CHIEF COMPLAINTS/PURPOSE OF CONSULTATION:   HISTORY OF PRESENTING ILLNESS:  Glenn Walter 22 y.o. male is here because of  anemia  August 19, 2021 admitted to [facility] health for severe symptomatic anemia with hemoglobin 3.9 EGD demonstrated gastritis; biopsy was negative for H. pylori.  Colonoscopy demonstrated a 4 cm rectal polyp.  Pathology was negative for carcinoma. CT chest abdomen pelvis showed rectal thickening with a masslike appearance.  No signs of metastatic disease. Patient received packed red blood cells and IV iron  October 05, 2021: Transanal endoscopic microsurgery for resection of a bulky pedunculated distal rectal polyp measuring 4 x 5 cm in size and resection of a smaller pedunculated 6 x 5 mm mass in left anterior distal mid rectum Pathology from the rectal mass demonstrated tubular adenoma with high-grade dysplasia.  Margins uninvolved by adenomatous change.  Negative for invasive carcinoma.  Deep margin negative. Smaller mass was ulcerated and acutely inflamed and was deemed a pseudopolyp  Dec 06, 2021: Patient was seen by [facility].   "Patient's mother is 44.  She has a brother who is cancer free.  Her mother had a probable diagnosis of breast cancer at 74.  She had a sister with breast cancer in her 21's, a brother with colon cancer in his 92's, a brother who died of unknown cancer, and another sister who had a TAH due to either abnormal cells or an abnormal genetic testing.  The grandmother's mother died of pancreatic cancer.  The father is living.  He has a sister who had thyroid cancer and a brother who died of liver cancer.  His parents are  deceased.  His mother died of melanoma, she had a sister who had colon cancer 2 times, another sister who had colon and breast cancer and a brother who had brian cancer.  The grandfather had a sister with brain cancer and a sister with colon cancer.   The patient's father has two full brothers, two paternal half brothers and a maternal half brother.  All are cancer free.  His mother died of multiple myeloma at 39 and his father died at 18 and was diagnosed with kidney cancer.  He had two sisters with lung cancer, a sister who had breast cancer twice and a sister who died of pancreatic cancer."  The patient was offered genetic testing which he declined due to concern that it could affect his career decisions  July 05 2022: Hemoglobin 15.3 ferritin 58  October 04, 2022: Scheduled follow-up for management of anemia, rectal polyp and probable familial cancer syndrome      May 09, 2022: WBC 6.5 hemoglobin 13.5 MCV 81 platelet count 293 ferritin 6  Review of Systems - Oncology  MEDICAL HISTORY: Past Medical History:  Diagnosis Date   Anemia    Family history of breast cancer    Family history of colon cancer    Family history of pancreatic cancer    Hemorrhoids    Rectal mass     SURGICAL HISTORY: Past Surgical History:  Procedure Laterality Date   ANTERIOR CRUCIATE LIGAMENT REPAIR Left 07/04/2021   Procedure: LEFT KNEE ANTERIOR CRUCIATE LIGAMENT RECONSTRUCTION WITH QUADRICEP AUTOGRAFT AND [place];  Surgeon: Meredith Pel, MD;  Location: [place];  Service: Orthopedics;  Laterality: Left;   BIOPSY  08/20/2021   Procedure: BIOPSY;  Surgeon: Carol Ada, MD;  Location: [facility] ENDOSCOPY;  Service: Endoscopy;;   BIOPSY  08/21/2021   Procedure: BIOPSY;  Surgeon: Thornton Park, MD;  Location: [place];  Service: Gastroenterology;;   COLONOSCOPY WITH PROPOFOL N/A 08/21/2021   Procedure: COLONOSCOPY WITH PROPOFOL;  Surgeon: Thornton Park, MD;   Location: [place];  Service: Gastroenterology;  Laterality: N/A;   ESOPHAGOGASTRODUODENOSCOPY (EGD) WITH PROPOFOL N/A 08/20/2021   Procedure: ESOPHAGOGASTRODUODENOSCOPY (EGD) WITH PROPOFOL;  Surgeon: Carol Ada, MD;  Location: [place];  Service: Endoscopy;  Laterality: N/A;   PARTIAL PROCTECTOMY BY TEM N/A 10/05/2021   Procedure: TEM PARTIAL PORCTECTOMY OF RECTAL MASS, PARTIAL RESECTION OF RECTAL MASS, PARTIAL PROCTECTOMY;  Surgeon: Michael Boston, MD;  Location: [facility];  Service: General;  Laterality: N/A;    SOCIAL HISTORY: Social History   Socioeconomic History   Marital status: Single    Spouse name: Not on file   Number of children: 0   Years of education: Not on file   Highest education level: Not on file  Occupational History   Occupation: Neurosurgeon  Tobacco Use   Smoking status: Never   Smokeless tobacco: Never  Vaping Use   Vaping Use: Never used  Substance and Sexual Activity   Alcohol use: Never   Drug use: Never   Sexual activity: Not Currently  Other Topics Concern   Not on file  Social History Narrative   Not on file   Social Determinants of Health   Financial Resource Strain: Not on file  Food Insecurity: Not on file  Transportation Needs: Not on file  Physical Activity: Not on file  Stress: Not on file  Social Connections: Not on file  Intimate Partner Violence: Not on file    FAMILY HISTORY Family History  Problem Relation Age of Onset   Diabetes Mother    Hypertension Father    Hyperlipidemia Father    Breast cancer Maternal Grandmother 45   Liver disease Maternal Grandmother    Diabetes Maternal Grandfather    Kidney disease Maternal Grandfather    Multiple myeloma Paternal Grandmother 13   Breast cancer Paternal Grandmother    Kidney cancer Paternal Grandfather 84   Heart disease Paternal Grandfather    Colon polyps Paternal Uncle    Breast cancer Other        MGA,  PGA   Colon cancer Other        MGU, MGGA x 2    Pancreatic cancer Other        PGA   Breast cancer Paternal Aunt    Pancreatic cancer Maternal Great-grandmother     ALLERGIES:  has No Known Allergies.  MEDICATIONS:  Current Outpatient Medications  Medication Sig Dispense Refill   ferrous sulfate 325 (65 FE) MG tablet Take 1 tablet (325 mg total) by mouth 2 (two) times daily with a meal. (Patient taking differently: Take 325 mg by mouth daily.) 60 tablet 2   No current facility-administered medications for this visit.    PHYSICAL EXAMINATION:  ECOG PERFORMANCE STATUS: {CHL ONC ECOG PS:[id number]}   There were no vitals filed for this visit.  There were no vitals filed for this visit.   Physical Exam   LABORATORY DATA: I have personally reviewed the data as listed:  No visits with results within 1 Month(s) from this visit.  Latest known visit with results is:  Appointment on 07/05/2022  Component Date Value Ref Range Status   Iron 07/05/2022 112  45 - 182 ug/dL Final   TIBC 07/05/2022 416  250 - 450 ug/dL Final   Saturation Ratios 07/05/2022 27  17.9 - 39.5 % Final   UIBC 07/05/2022 304  ug/dL Final   Performed at [facility] [address], [place], [place] [address]   Ferritin 07/05/2022 58  24 - 336 ng/mL Final   Performed at [facility], [place] [address], [place], [place] [address]   WBC Count 07/05/2022 5.8  4.0 - 10.5 K/uL Final   RBC 07/05/2022 5.25  4.22 - 5.81 MIL/uL Final   Hemoglobin 07/05/2022 15.3  13.0 - 17.0 g/dL Final   HCT 07/05/2022 47.6  39.0 - 52.0 % Final   MCV 07/05/2022 90.7  80.0 - 100.0 fL Final   MCH 07/05/2022 29.1  26.0 - 34.0 pg Final   MCHC 07/05/2022 32.1  30.0 - 36.0 g/dL Final   RDW 07/05/2022 16.5 (H)  11.5 - 15.5 % Final   Platelet Count 07/05/2022 266  150 - 400 K/uL Final   nRBC 07/05/2022 0.0  0.0 - 0.2 % Final   Neutrophils Relative % 07/05/2022 66  % Final   Neutro Abs 07/05/2022 3.8  1.7 - 7.7 K/uL Final   Lymphocytes Relative  07/05/2022 20  % Final   Lymphs Abs 07/05/2022 1.1  0.7 - 4.0 K/uL Final   Monocytes Relative 07/05/2022 11  % Final   Monocytes Absolute 07/05/2022 0.6  0.1 - 1.0 K/uL Final   Eosinophils Relative 07/05/2022 2  % Final   Eosinophils Absolute 07/05/2022 0.1  0.0 - 0.5 K/uL Final   Basophils Relative 07/05/2022 1  % Final   Basophils Absolute 07/05/2022 0.1  0.0 - 0.1 K/uL Final   Immature Granulocytes 07/05/2022 0  % Final   Abs Immature Granulocytes 07/05/2022 0.02  0.00 - 0.07 K/uL Final   Performed at [facility], [place] [address], [place], [place] [address]    RADIOGRAPHIC STUDIES: I have personally reviewed the radiological images as listed and agree with the findings in the report  No results found.  ASSESSMENT/PLAN Cancer Staging  No matching staging information was found for the patient.   No problem-specific Assessment & Plan notes found for this encounter.    No orders of the defined types were placed in this encounter.     minutes was spent in patient care.  This included time spent preparing to see the patient (e.g., review of tests), obtaining and/or reviewing separately obtained history, counseling and educating the patient/family/caregiver, ordering medications, tests, or procedures; documenting clinical information in the electronic or other health record, independently interpreting results and communicating results to the patient/family/caregiver as well as coordination of care.       All questions were answered. The patient knows to call the clinic with any problems, questions or concerns.  This note was electronically signed.    Barbee Cough, MD  10/04/2022 8:59 AM

## 2022-10-09 ENCOUNTER — Inpatient Hospital Stay: Payer: Commercial Managed Care - PPO | Attending: Physician Assistant

## 2022-10-09 ENCOUNTER — Inpatient Hospital Stay: Payer: Commercial Managed Care - PPO | Admitting: Oncology

## 2022-10-09 VITALS — BP 110/71 | HR 76 | Temp 97.9°F | Resp 14 | Ht 74.0 in | Wt 218.6 lb

## 2022-10-09 DIAGNOSIS — D649 Anemia, unspecified: Secondary | ICD-10-CM | POA: Diagnosis not present

## 2022-10-09 DIAGNOSIS — Z833 Family history of diabetes mellitus: Secondary | ICD-10-CM | POA: Insufficient documentation

## 2022-10-09 DIAGNOSIS — K6289 Other specified diseases of anus and rectum: Secondary | ICD-10-CM | POA: Diagnosis not present

## 2022-10-09 DIAGNOSIS — Z8349 Family history of other endocrine, nutritional and metabolic diseases: Secondary | ICD-10-CM | POA: Insufficient documentation

## 2022-10-09 DIAGNOSIS — Z803 Family history of malignant neoplasm of breast: Secondary | ICD-10-CM

## 2022-10-09 DIAGNOSIS — Z83719 Family history of colon polyps, unspecified: Secondary | ICD-10-CM | POA: Insufficient documentation

## 2022-10-09 DIAGNOSIS — D500 Iron deficiency anemia secondary to blood loss (chronic): Secondary | ICD-10-CM | POA: Diagnosis not present

## 2022-10-09 DIAGNOSIS — K621 Rectal polyp: Secondary | ICD-10-CM | POA: Insufficient documentation

## 2022-10-09 DIAGNOSIS — Z800 Family history of malignant neoplasm of digestive organs: Secondary | ICD-10-CM | POA: Diagnosis not present

## 2022-10-09 DIAGNOSIS — D126 Benign neoplasm of colon, unspecified: Secondary | ICD-10-CM

## 2022-10-09 DIAGNOSIS — Z79899 Other long term (current) drug therapy: Secondary | ICD-10-CM | POA: Insufficient documentation

## 2022-10-09 DIAGNOSIS — Z8719 Personal history of other diseases of the digestive system: Secondary | ICD-10-CM | POA: Diagnosis not present

## 2022-10-09 DIAGNOSIS — Z841 Family history of disorders of kidney and ureter: Secondary | ICD-10-CM | POA: Diagnosis not present

## 2022-10-09 DIAGNOSIS — Z8249 Family history of ischemic heart disease and other diseases of the circulatory system: Secondary | ICD-10-CM | POA: Diagnosis not present

## 2022-10-09 DIAGNOSIS — Z808 Family history of malignant neoplasm of other organs or systems: Secondary | ICD-10-CM | POA: Insufficient documentation

## 2022-10-09 LAB — COMPREHENSIVE METABOLIC PANEL
ALT: 20 U/L (ref 0–44)
AST: 21 U/L (ref 15–41)
Albumin: 4.6 g/dL (ref 3.5–5.0)
Alkaline Phosphatase: 86 U/L (ref 38–126)
Anion gap: 9 (ref 5–15)
BUN: 18 mg/dL (ref 6–20)
CO2: 25 mmol/L (ref 22–32)
Calcium: 9.1 mg/dL (ref 8.9–10.3)
Chloride: 103 mmol/L (ref 98–111)
Creatinine, Ser: 0.86 mg/dL (ref 0.61–1.24)
GFR, Estimated: >60 mL/min (ref >60)
Glucose, Bld: 89 mg/dL (ref 70–99)
Potassium: 4.3 mmol/L (ref 3.5–5.1)
Sodium: 137 mmol/L (ref 135–145)
Total Bilirubin: 1.7 mg/dL — ABNORMAL HIGH (ref 0.3–1.2)
Total Protein: 7.7 g/dL (ref 6.5–8.1)

## 2022-10-09 LAB — CBC WITH DIFFERENTIAL/PLATELET
Abs Immature Granulocytes: 0.03 10*3/uL (ref 0.00–0.07)
Basophils Absolute: 0.1 10*3/uL (ref 0.0–0.1)
Basophils Relative: 1 %
Eosinophils Absolute: 0.1 10*3/uL (ref 0.0–0.5)
Eosinophils Relative: 1 %
HCT: 47.5 % (ref 39.0–52.0)
Hemoglobin: 15.8 g/dL (ref 13.0–17.0)
Immature Granulocytes: 0 %
Lymphocytes Relative: 16 %
Lymphs Abs: 1.2 10*3/uL (ref 0.7–4.0)
MCH: 31.2 pg (ref 26.0–34.0)
MCHC: 33.3 g/dL (ref 30.0–36.0)
MCV: 93.7 fL (ref 80.0–100.0)
Monocytes Absolute: 0.8 10*3/uL (ref 0.1–1.0)
Monocytes Relative: 11 %
Neutro Abs: 5.2 10*3/uL (ref 1.7–7.7)
Neutrophils Relative %: 71 %
Platelets: 275 10*3/uL (ref 150–400)
RBC: 5.07 MIL/uL (ref 4.22–5.81)
RDW: 13.4 % (ref 11.5–15.5)
WBC: 7.3 10*3/uL (ref 4.0–10.5)
nRBC: 0 % (ref 0.0–0.2)

## 2022-10-09 LAB — FERRITIN: Ferritin: 50 ng/mL (ref 24–336)

## 2022-10-09 NOTE — Progress Notes (Unsigned)
[place] Initial Visit:  Patient Care Team: Mateo Flow, MD as PCP - General (Family Medicine) Michael Boston, MD as Consulting Physician (General Surgery) Thornton Park, MD as Consulting Physician (Gastroenterology) Carol Ada, MD as Consulting Physician (Gastroenterology)  CHIEF COMPLAINTS/PURPOSE OF CONSULTATION:   HISTORY OF PRESENTING ILLNESS:  Glenn Walter 22 y.o. male is here because of  anemia  August 19, 2021 admitted to [facility] health for severe symptomatic anemia with hemoglobin 3.9 EGD demonstrated gastritis; biopsy was negative for H. pylori.  Colonoscopy demonstrated a 4 cm rectal polyp.  Pathology was negative for carcinoma. CT chest abdomen pelvis showed rectal thickening with a masslike appearance.  No signs of metastatic disease. Patient received packed red blood cells and IV iron  October 05, 2021: Transanal endoscopic microsurgery for resection of a bulky pedunculated distal rectal polyp measuring 4 x 5 cm in size and resection of a smaller pedunculated 6 x 5 mm mass in left anterior distal mid rectum Pathology from the rectal mass demonstrated tubular adenoma with high-grade dysplasia.  Margins uninvolved by adenomatous change.  Negative for invasive carcinoma.  Deep margin negative. Smaller mass was ulcerated and acutely inflamed and was deemed a pseudopolyp  Dec 06, 2021: Patient was seen by [facility].   "Patient's mother is 12.  She has a brother who is cancer free.  Her mother had a probable diagnosis of breast cancer at 11.  She had a sister with breast cancer in her 32's, a brother with colon cancer in his 10's, a brother who died of unknown cancer, and another sister who had a TAH due to either abnormal cells or an abnormal genetic testing.  The grandmother's mother died of pancreatic cancer.  The father is living.  He has a sister who had thyroid cancer and a brother who died of liver cancer.  His parents are  deceased.  His mother died of melanoma, she had a sister who had colon cancer 2 times, another sister who had colon and breast cancer and a brother who had brian cancer.  The grandfather had a sister with brain cancer and a sister with colon cancer.   The patient's father has two full brothers, two paternal half brothers and a maternal half brother.  All are cancer free.  His mother died of multiple myeloma at 46 and his father died at 4 and was diagnosed with kidney cancer.  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  Patient enlisted into [employer] but [employer] cancelled this because of his recent medical history.  Per patient he will be able to re-enlist in beginning of 2025 pending a good health review.  Currently employed at [employer].  He does not have a follow up colonoscopy schedule.  Not currently taking oral iron He is not having any hematochezia, melena nor is he having rectal pain or constipation.        Review of Systems  Constitutional:  Negative for appetite change, chills, fatigue, fever and unexpected weight change.  HENT:   Negative  for mouth sores, nosebleeds, sore throat, tinnitus and trouble swallowing.   Eyes:  Negative for eye problems and icterus.       Vision changes:  None  Respiratory:  Negative for chest tightness, cough, hemoptysis, shortness of breath and wheezing.        PND:  none Orthopnea:  none DOE:    Cardiovascular:  Negative for chest pain, leg swelling and palpitations.       PND:  none Orthopnea:  none   Gastrointestinal:  Negative for abdominal pain, blood in stool, constipation, diarrhea, nausea, rectal pain and vomiting.  Endocrine: Negative for hot flashes.       Cold intolerance:  none Heat intolerance:  none  Genitourinary:  Negative for bladder incontinence, difficulty urinating, dysuria, frequency, hematuria and nocturia.   Musculoskeletal:  Negative for arthralgias, back pain, myalgias, neck pain and neck stiffness.  Skin:  Negative for itching, rash and wound.  Hematological:  Negative for adenopathy. Does not bruise/bleed easily.  Psychiatric/Behavioral:  Negative for sleep disturbance and suicidal ideas. The patient is not nervous/anxious.     MEDICAL HISTORY: Past Medical History:  Diagnosis Date   Anemia    Family history of breast cancer    Family history of colon cancer    Family history of pancreatic cancer    Hemorrhoids    Rectal mass     SURGICAL HISTORY: Past Surgical History:  Procedure Laterality Date   ANTERIOR CRUCIATE LIGAMENT REPAIR Left 07/04/2021   Procedure: LEFT KNEE ANTERIOR CRUCIATE LIGAMENT RECONSTRUCTION WITH QUADRICEP AUTOGRAFT AND MEDIAL MENISCAL REPAIR VERSUS DEBRIDEMENT;  Surgeon: Meredith Pel, MD;  Location: [place];  Service: Orthopedics;  Laterality: Left;   BIOPSY  08/20/2021   Procedure: BIOPSY;  Surgeon: Carol Ada, MD;  Location: [facility] ENDOSCOPY;  Service: Endoscopy;;   BIOPSY  08/21/2021   Procedure: BIOPSY;  Surgeon: Thornton Park, MD;  Location: [place];  Service: Gastroenterology;;   COLONOSCOPY WITH PROPOFOL N/A 08/21/2021   Procedure: COLONOSCOPY WITH PROPOFOL;  Surgeon: Thornton Park, MD;  Location: [place];  Service: Gastroenterology;  Laterality: N/A;   ESOPHAGOGASTRODUODENOSCOPY (EGD) WITH PROPOFOL N/A 08/20/2021   Procedure: ESOPHAGOGASTRODUODENOSCOPY (EGD) WITH PROPOFOL;  Surgeon: Carol Ada, MD;  Location: [place];  Service: Endoscopy;  Laterality: N/A;   PARTIAL PROCTECTOMY BY TEM N/A 10/05/2021    Procedure: TEM PARTIAL PORCTECTOMY OF RECTAL MASS, PARTIAL RESECTION OF RECTAL MASS, PARTIAL PROCTECTOMY;  Surgeon: Michael Boston, MD;  Location: [facility];  Service: General;  Laterality: N/A;    SOCIAL HISTORY: Social History   Socioeconomic History   Marital status: Single    Spouse name: Not on file   Number of children: 0   Years of education: Not on file   Highest education level: Not on file  Occupational History   Occupation: Neurosurgeon  Tobacco Use   Smoking status: Never   Smokeless tobacco: Never  Vaping Use   Vaping Use: Never used  Substance and Sexual Activity   Alcohol use: Never   Drug use: Never   Sexual activity: Not Currently  Other Topics Concern   Not on file  Social History Narrative   Not on file   Social Determinants of Health   Financial Resource Strain: Not on file  Food Insecurity: Not on file  Transportation Needs: Not on file  Physical Activity: Not on file  Stress: Not on file  Social Connections: Not on file  Intimate Partner Violence: Not on file    FAMILY HISTORY Family History  Problem Relation Age of Onset   Diabetes Mother    Hypertension Father    Hyperlipidemia Father    Breast cancer Maternal Grandmother 17   Liver disease Maternal Grandmother    Diabetes Maternal Grandfather    Kidney disease Maternal Grandfather    Multiple myeloma Paternal Grandmother 65   Breast cancer Paternal Grandmother    Kidney cancer Paternal Grandfather 75   Heart disease Paternal Grandfather    Colon polyps Paternal Uncle    Breast cancer Other        MGA,  PGA   Colon cancer Other        MGU, MGGA x 2   Pancreatic cancer Other        PGA   Breast cancer Paternal Aunt    Pancreatic cancer Maternal Great-grandmother     ALLERGIES:  has No Known Allergies.  MEDICATIONS:  Current Outpatient Medications  Medication Sig Dispense Refill   ferrous sulfate 325 (65 FE) MG tablet Take 1 tablet (325 mg total) by mouth 2 (two)  times daily with a meal. (Patient taking differently: Take 325 mg by mouth daily.) 60 tablet 2   No current facility-administered medications for this visit.    PHYSICAL EXAMINATION:  ECOG PERFORMANCE STATUS: {CHL ONC ECOG PS:[id number]}   There were no vitals filed for this visit.  There were no vitals filed for this visit.   Physical Exam Vitals and nursing note reviewed.  Constitutional:      Appearance: Normal appearance. He is not diaphoretic.  HENT:     Head: Normocephalic and atraumatic.     Right Ear: External ear normal.     Left Ear: External ear normal.     Nose: Nose normal.  Eyes:     General: No scleral icterus.    Conjunctiva/sclera: Conjunctivae normal.     Pupils: Pupils are equal, round, and reactive to light.  Cardiovascular:     Rate and Rhythm: Normal rate and regular rhythm.     Heart sounds: No murmur heard.    No friction rub. No gallop.  Pulmonary:     Effort: Pulmonary effort is normal. No respiratory distress.     Breath sounds: Normal breath sounds. No stridor. No wheezing.  Abdominal:     General: Bowel sounds are normal. There is no distension.     Tenderness: There is no abdominal tenderness. There is no guarding or rebound.     Hernia: No hernia is present.  Musculoskeletal:        General: No swelling, tenderness, deformity or signs of injury. Normal range of motion.     Cervical back: Normal range of motion and neck supple. No rigidity or tenderness.     Right lower leg: No edema.     Left lower leg: No edema.  Lymphadenopathy:     Head:     Right side of head: No submental, submandibular, tonsillar, preauricular, posterior auricular or occipital adenopathy.     Left side of head: No submental, submandibular, tonsillar, preauricular, posterior auricular or occipital adenopathy.     Cervical: No cervical adenopathy.     Right cervical: No superficial, deep or posterior cervical adenopathy.    Left cervical: No superficial, deep or  posterior cervical adenopathy.     Upper Body:     Right upper body: No supraclavicular or axillary adenopathy.     Left upper body: No supraclavicular or axillary adenopathy.     Lower Body: No right inguinal adenopathy. No left inguinal  adenopathy.  Skin:    Coloration: Skin is not jaundiced or pale.     Findings: No bruising or erythema.  Neurological:     General: No focal deficit present.     Mental Status: He is alert and oriented to person, place, and time.     Cranial Nerves: No cranial nerve deficit.     Motor: No weakness.     Gait: Gait normal.  Psychiatric:        Mood and Affect: Mood normal.        Behavior: Behavior normal.        Thought Content: Thought content normal.        Judgment: Judgment normal.      LABORATORY DATA: I have personally reviewed the data as listed:  No visits with results within 1 Month(s) from this visit.  Latest known visit with results is:  Appointment on 07/05/2022  Component Date Value Ref Range Status   Iron 07/05/2022 112  45 - 182 ug/dL Final   TIBC 07/05/2022 416  250 - 450 ug/dL Final   Saturation Ratios 07/05/2022 27  17.9 - 39.5 % Final   UIBC 07/05/2022 304  ug/dL Final   Performed at [facility], [place] [address], [place], [place] [address]   Ferritin 07/05/2022 58  24 - 336 ng/mL Final   Performed at [facility], [place] [address] [place], [place] [address]   WBC Count 07/05/2022 5.8  4.0 - 10.5 K/uL Final   RBC 07/05/2022 5.25  4.22 - 5.81 MIL/uL Final   Hemoglobin 07/05/2022 15.3  13.0 - 17.0 g/dL Final   HCT 07/05/2022 47.6  39.0 - 52.0 % Final   MCV 07/05/2022 90.7  80.0 - 100.0 fL Final   MCH 07/05/2022 29.1  26.0 - 34.0 pg Final   MCHC 07/05/2022 32.1  30.0 - 36.0 g/dL Final   RDW 07/05/2022 16.5 (H)  11.5 - 15.5 % Final   Platelet Count 07/05/2022 266  150 - 400 K/uL Final   nRBC 07/05/2022 0.0  0.0 - 0.2 % Final   Neutrophils Relative % 07/05/2022 66  % Final   Neutro Abs  07/05/2022 3.8  1.7 - 7.7 K/uL Final   Lymphocytes Relative 07/05/2022 20  % Final   Lymphs Abs 07/05/2022 1.1  0.7 - 4.0 K/uL Final   Monocytes Relative 07/05/2022 11  % Final   Monocytes Absolute 07/05/2022 0.6  0.1 - 1.0 K/uL Final   Eosinophils Relative 07/05/2022 2  % Final   Eosinophils Absolute 07/05/2022 0.1  0.0 - 0.5 K/uL Final   Basophils Relative 07/05/2022 1  % Final   Basophils Absolute 07/05/2022 0.1  0.0 - 0.1 K/uL Final   Immature Granulocytes 07/05/2022 0  % Final   Abs Immature Granulocytes 07/05/2022 0.02  0.00 - 0.07 K/uL Final   Performed at [facility], [place] [address], [place], [place] [address]    RADIOGRAPHIC STUDIES: I have personally reviewed the radiological images as listed and agree with the findings in the report  No results found.  ASSESSMENT/PLAN  Cancer Staging  No matching staging information was found for the patient.   No problem-specific Assessment & Plan notes found for this encounter.    No orders of the defined types were placed in this encounter.     minutes was spent in patient care.  This included time spent preparing to see the patient (e.g., review of tests), obtaining and/or reviewing separately obtained history,  counseling and educating the patient/family/caregiver, ordering medications, tests, or procedures; documenting clinical information in the electronic or other health record, independently interpreting results and communicating results to the patient/family/caregiver as well as coordination of care.       All questions were answered. The patient knows to call the clinic with any problems, questions or concerns.  This note was electronically signed.    Barbee Cough, MD  10/09/2022 9:10 AM

## 2022-10-11 ENCOUNTER — Encounter: Payer: Self-pay | Admitting: Hematology and Oncology

## 2022-11-21 IMAGING — CT CT CHEST-ABD-PELV W/ CM
2 of 4 series · 14 of 46 positions shown, 16 images · IV contrast (agent unspecified)
Comparison: None

CLINICAL DATA: Rectal cancer staging in a male at age 20.

EXAM:
CT CHEST, ABDOMEN, AND PELVIS WITH CONTRAST
TECHNIQUE: Multidetector CT imaging of the chest, abdomen and pelvis was
performed following the standard protocol during bolus
administration of intravenous contrast.

[Series 3: cap with · axial · 0.86mm/px · z∈[+1074,+1674]mm · 11 of 142 slices shown, 13 images]
[im 11/142  soft-tissue]
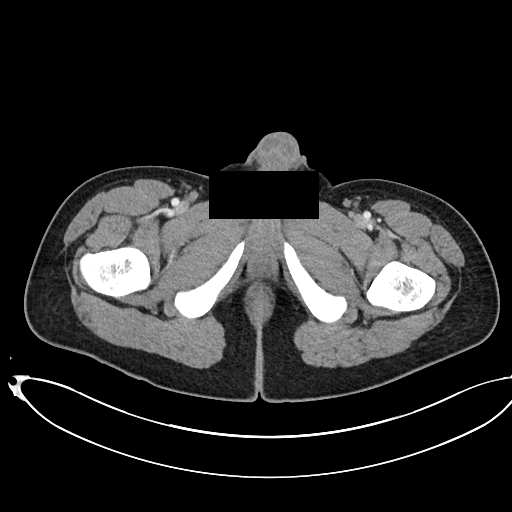
[im 11/142  bone]
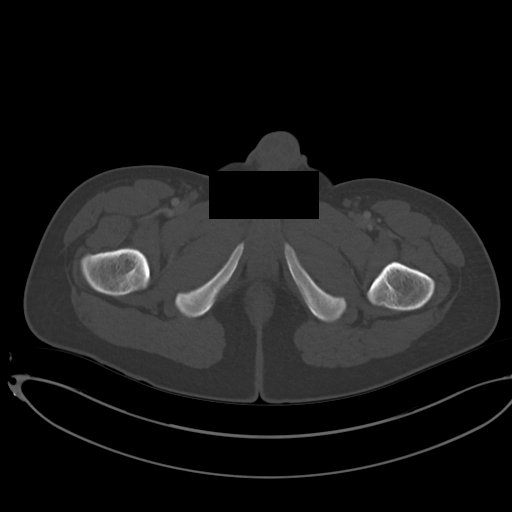
[im 21/142  soft-tissue]
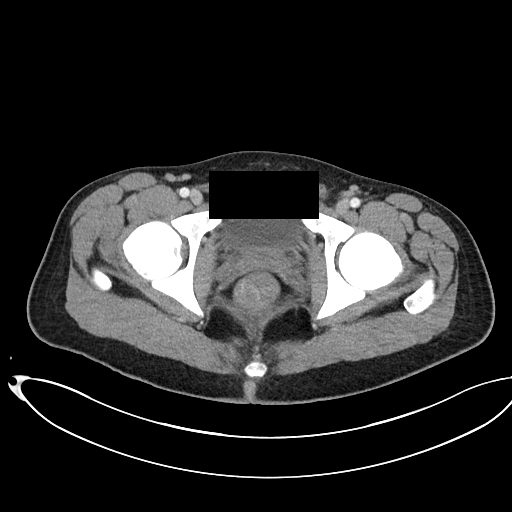
[im 31/142  soft-tissue]
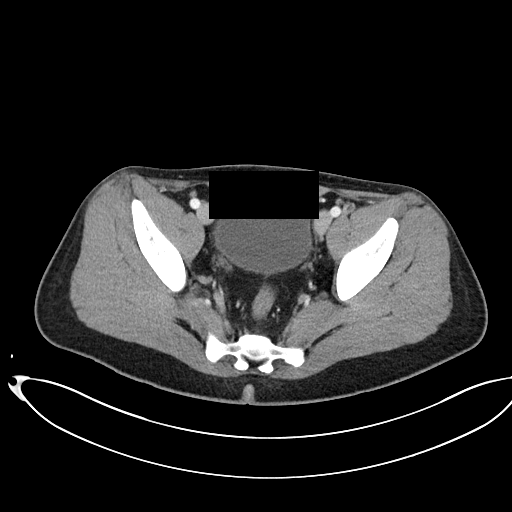
[im 51/142  soft-tissue]
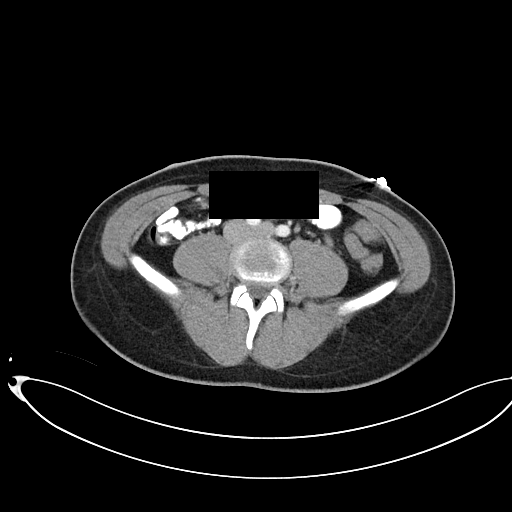
[im 61/142  soft-tissue]
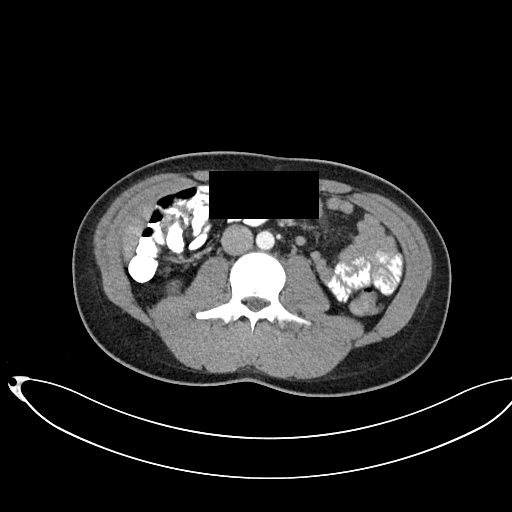
[im 71/142  soft-tissue]
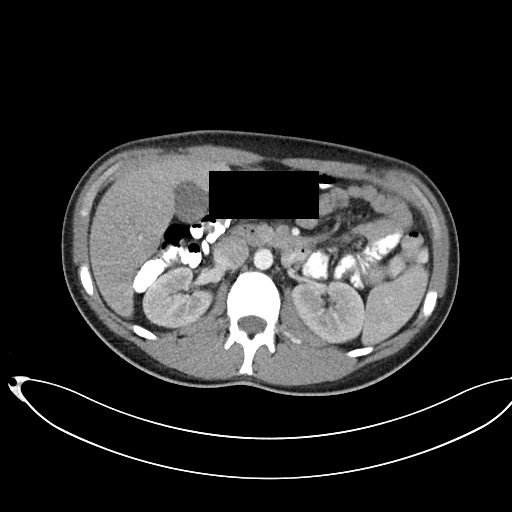
[im 81/142  soft-tissue]
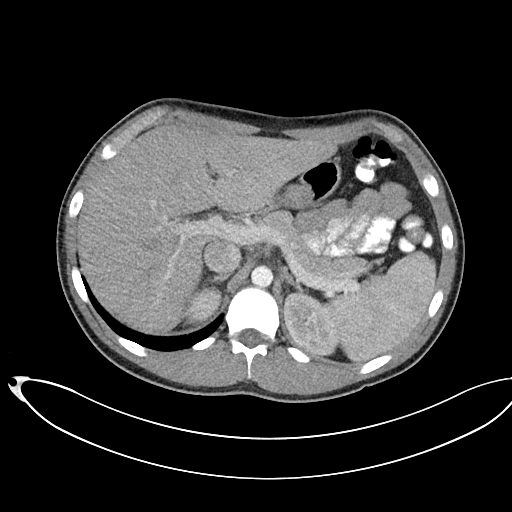
[im 91/142  soft-tissue]
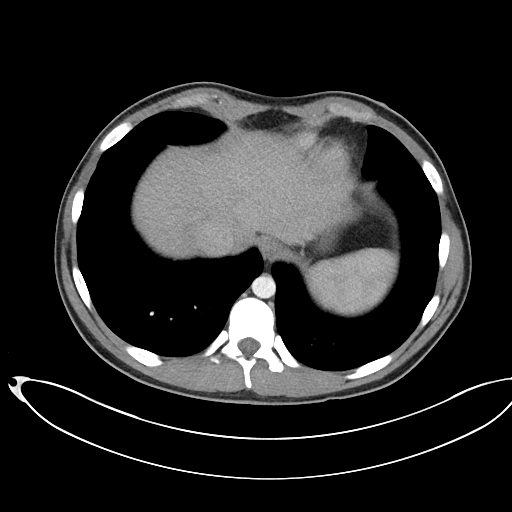
[im 111/142  soft-tissue]
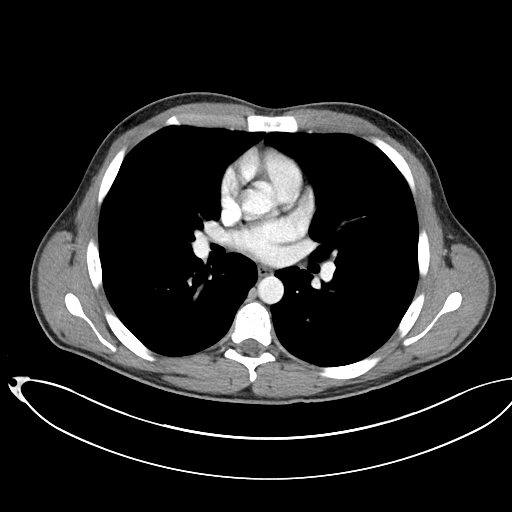
[im 111/142  bone]
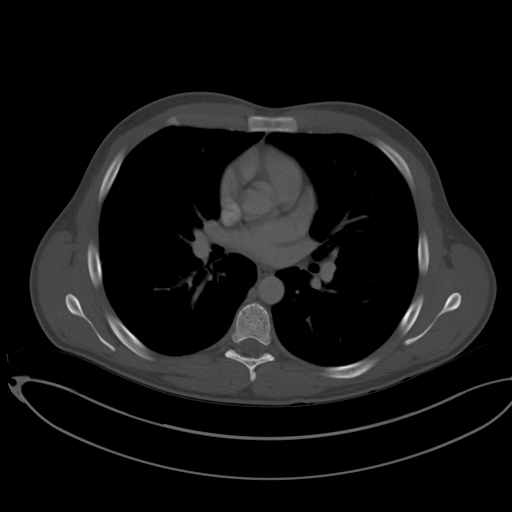
[im 121/142  soft-tissue]
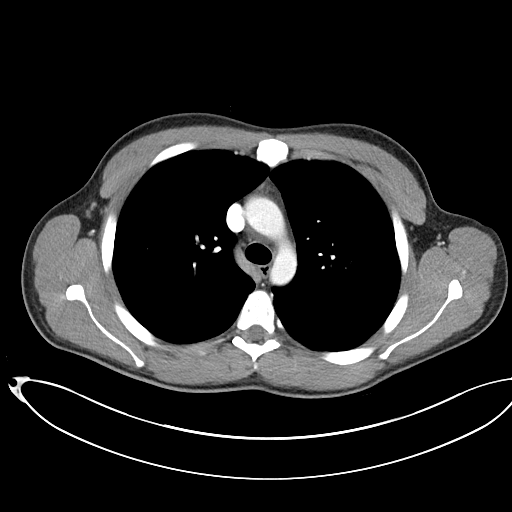
[im 131/142  soft-tissue]
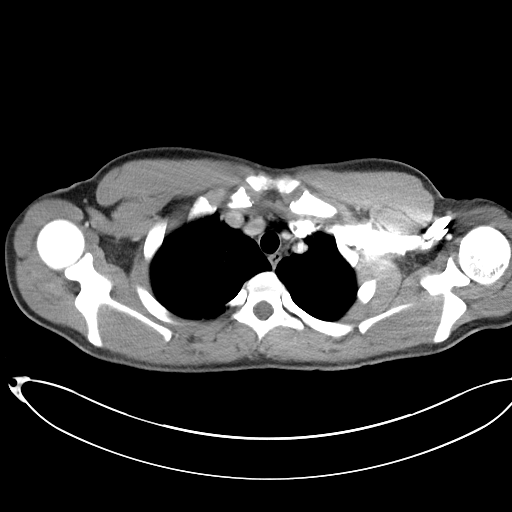

[Series 6: cor · coronal · 0.93mm/px · 3 of 100 slices shown]
[im 34/100  soft-tissue]
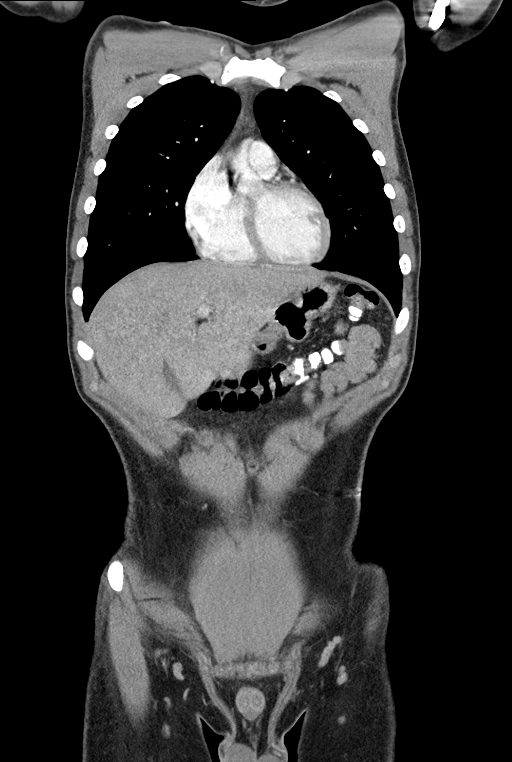
[im 45/100  soft-tissue]
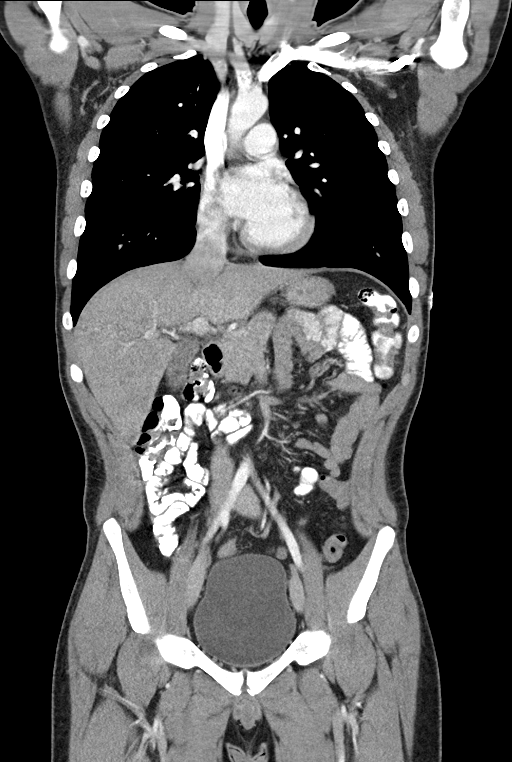
[im 56/100  soft-tissue]
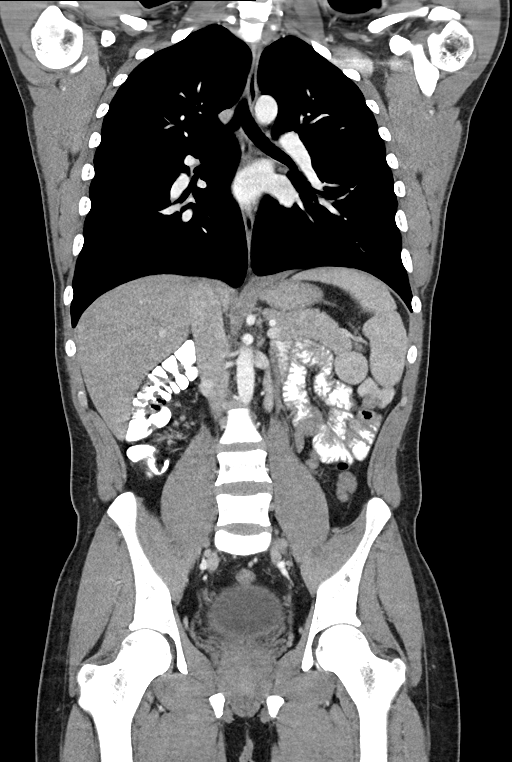

[14 of 46 positions shown; findings below may reference images not displayed]

RADIATION DOSE REDUCTION: This exam was performed according to the
departmental dose-optimization program which includes automated
exposure control, adjustment of the mA and/or kV according to
patient size and/or use of iterative reconstruction technique.

CONTRAST:  100mL OMNIPAQUE IOHEXOL 300 MG/ML  SOLN
FINDINGS: CT CHEST FINDINGS

Cardiovascular: Normal appearance of the heart and great vessels in
the chest.

Mediastinum/Nodes: Thoracic inlet structures are normal. No axillary
lymphadenopathy. No mediastinal or hilar lymphadenopathy.

Lungs/Pleura: Tiny pulmonary nodule in the RIGHT lower lobe (image
83/5) is along the pleural based RIGHT lower lobe.

(Image 114/5) 5 mm RIGHT lower lobe nodule also along the pleural
surface. No consolidation. No pleural effusion or pneumothorax.
Airways are patent.

Musculoskeletal: See below for full musculoskeletal details.

CT ABDOMEN PELVIS FINDINGS

Hepatobiliary: Liver assessment mildly limited on venous phase due
to phase of contrast enhancement. No signs of focal, suspicious
hepatic lesion. No pericholecystic stranding. No biliary duct
distension. Portal vein is patent.

Pancreas: Normal, without mass, inflammation or ductal dilatation.

Spleen: Normal.

Adrenals/Urinary Tract: Adrenal glands are normal. No perinephric
stranding or suspicious renal lesion. No hydronephrosis. No
perivesical stranding.

Stomach/Bowel: No sign of stranding adjacent to the stomach or small
bowel. No evidence of small-bowel obstruction. The appendix is
normal. No signs of colonic obstruction despite large rectal mass
that is suggested on image 119 of series 3. In total this measures
approximately 4.6 x 4.6 cm. No signs of adenopathy at the level of
the IMA origin or in the retroperitoneum or pelvis.

Vascular/Lymphatic:

Aorta with smooth contours. IVC with smooth contours. No aneurysmal
dilation of the abdominal aorta. There is no gastrohepatic or
hepatoduodenal ligament lymphadenopathy. No retroperitoneal or
mesenteric lymphadenopathy.

No pelvic sidewall lymphadenopathy.

Reproductive: Unremarkable.

Other: No ascites.

Musculoskeletal: No signs of acute or destructive bone process.
IMPRESSION: Signs of rectal thickening with masslike appearance, likely the site
of reported rectal neoplasm.

No definite signs of metastatic disease. Hepatic assessment mildly
limited by phase of contrast enhancement.

Small pulmonary nodules are nonspecific at this time largest
approximately 5 mm, suggest short interval follow-up.

## 2022-11-23 ENCOUNTER — Encounter: Payer: Self-pay | Admitting: Hematology and Oncology

## 2023-01-02 ENCOUNTER — Inpatient Hospital Stay: Payer: Commercial Managed Care - PPO

## 2023-01-02 ENCOUNTER — Inpatient Hospital Stay: Payer: Commercial Managed Care - PPO | Attending: Physician Assistant | Admitting: Oncology

## 2023-01-02 NOTE — Progress Notes (Deleted)
[facility] Cancer Follow up Visit:  Patient Care Team: Lise Auer, MD as PCP - General (Family Medicine) Karie Soda, MD as Consulting Physician (General Surgery) Tressia Danas, MD as Consulting Physician (Gastroenterology) Jeani Hawking, MD as Consulting Physician (Gastroenterology)  CHIEF COMPLAINTS/PURPOSE OF CONSULTATION:   HISTORY OF PRESENTING ILLNESS:  Glenn Walter 22 y.o. male is here because of  anemia  August 19, 2021 admitted to [facility] for severe symptomatic anemia with hemoglobin 3.9 EGD demonstrated gastritis; biopsy was negative for H. pylori.  Colonoscopy demonstrated a 4 cm rectal polyp.  Pathology was negative for carcinoma. CT chest abdomen pelvis showed rectal thickening with a masslike appearance.  No signs of metastatic disease. Patient received packed red blood cells and IV iron  October 05, 2021: Transanal endoscopic microsurgery for resection of a bulky pedunculated distal rectal polyp measuring 4 x 5 cm in size and resection of a smaller pedunculated 6 x 5 mm mass in left anterior distal mid rectum Pathology from the rectal mass demonstrated tubular adenoma with high-grade dysplasia.  Margins uninvolved by adenomatous change.  Negative for invasive carcinoma.  Deep margin negative. Smaller mass was ulcerated and acutely inflamed and was deemed a pseudopolyp  Dec 06, 2021: Patient was seen by [facility] genetics.   "Patient's mother is 56.  She has a brother who is cancer free.  Her mother had a probable diagnosis of breast cancer at 60.  She had a sister with breast cancer in her 21's, a brother with colon cancer in his 48's, a brother who died of unknown cancer, and another sister who had a TAH due to either abnormal cells or an abnormal genetic testing.  The grandmother's mother died of pancreatic cancer.  The father is living.  He has a sister who had thyroid cancer and a brother who died of liver cancer.  His parents are  deceased.  His mother died of melanoma, she had a sister who had colon cancer 2 times, another sister who had colon and breast cancer and a brother who had brian cancer.  The grandfather had a sister with brain cancer and a sister with colon cancer.   The patient's father has two full brothers, two paternal half brothers and a maternal half brother.  All are cancer free.  His mother died of multiple myeloma at 39 and his father died at 74 and was diagnosed with kidney cancer.  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  Patient enlisted into [employer] but [employer] cancelled this because of his recent medical history.  Per patient he will be able to re-enlist in beginning of 2025 pending a good health review.  Currently employed at [employer].  He does not have a follow up colonoscopy schedule.  Not currently taking oral iron He is not having any hematochezia, melena nor is he having rectal pain or constipation.        Review of Systems  Constitutional:  Negative for appetite change, chills, fatigue, fever and unexpected weight change.  HENT:  Negative for mouth sores, nosebleeds, sore throat, tinnitus and trouble swallowing.   Eyes:  Negative for eye problems and icterus.       Vision changes:  None  Respiratory:  Negative for chest tightness, cough, hemoptysis, shortness of breath and wheezing.        PND:  none Orthopnea:  none DOE:    Cardiovascular:  Negative for chest pain, leg swelling and palpitations.       PND:  none Orthopnea:  none   Gastrointestinal:  Negative for abdominal pain, blood in stool, constipation, diarrhea, nausea, rectal pain and vomiting.  Endocrine: Negative for hot flashes.       Cold intolerance:  none Heat intolerance:  none  Genitourinary:  Negative for bladder incontinence, difficulty urinating, dysuria, frequency, hematuria and nocturia.   Musculoskeletal:  Negative for arthralgias, back pain, myalgias, neck pain and neck stiffness.  Skin:  Negative for itching, rash and wound.  Hematological:  Negative for adenopathy. Does not bruise/bleed easily.  Psychiatric/Behavioral:  Negative for sleep disturbance and suicidal ideas. The patient is not nervous/anxious.     MEDICAL HISTORY: Past Medical History:  Diagnosis Date   Anemia    Family history of breast cancer    Family history of colon cancer    Family history of pancreatic cancer    Hemorrhoids    Rectal mass     SURGICAL HISTORY: Past Surgical History:  Procedure Laterality Date   ANTERIOR CRUCIATE LIGAMENT REPAIR Left 07/04/2021   Procedure: LEFT KNEE ANTERIOR CRUCIATE LIGAMENT RECONSTRUCTION WITH QUADRICEP AUTOGRAFT AND MEDIAL MENISCAL REPAIR VERSUS DEBRIDEMENT;  Surgeon: Cammy Copa, MD;  Location: [facility] OR;  Service: Orthopedics;  Laterality: Left;   BIOPSY  08/20/2021   Procedure: BIOPSY;  Surgeon: Jeani Hawking, MD;  Location: [facility] ENDOSCOPY;  Service: Endoscopy;;   BIOPSY  08/21/2021   Procedure: BIOPSY;  Surgeon: Tressia Danas, MD;  Location: [facility] ENDOSCOPY;  Service: Gastroenterology;;   COLONOSCOPY WITH PROPOFOL N/A 08/21/2021   Procedure: COLONOSCOPY WITH PROPOFOL;  Surgeon: Tressia Danas, MD;  Location: [facility] ENDOSCOPY;  Service: Gastroenterology;  Laterality: N/A;   ESOPHAGOGASTRODUODENOSCOPY (EGD) WITH PROPOFOL N/A 08/20/2021   Procedure: ESOPHAGOGASTRODUODENOSCOPY (EGD) WITH PROPOFOL;  Surgeon: Jeani Hawking, MD;  Location: [facility] ENDOSCOPY;  Service: Endoscopy;  Laterality: N/A;   PARTIAL PROCTECTOMY BY TEM N/A 10/05/2021    Procedure: TEM PARTIAL PORCTECTOMY OF RECTAL MASS, PARTIAL RESECTION OF RECTAL MASS, PARTIAL PROCTECTOMY;  Surgeon: Karie Soda, MD;  Location: [facility] ORS;  Service: General;  Laterality: N/A;    SOCIAL HISTORY: Social History   Socioeconomic History   Marital status: Single    Spouse name: Not on file   Number of children: 0   Years of education: Not on file   Highest education level: Not on file  Occupational History   Occupation: Dietitian  Tobacco Use   Smoking status: Never   Smokeless tobacco: Never  Vaping Use   Vaping Use: Never used  Substance and Sexual Activity   Alcohol use: Never   Drug use: Never   Sexual activity: Not Currently  Other Topics Concern   Not on file  Social History Narrative   Not on file   Social Determinants of Health   Financial Resource Strain: Not on file  Food Insecurity: Not on file  Transportation Needs: Not on file  Physical Activity: Not on file  Stress: Not on file  Social Connections: Not on file  Intimate Partner Violence: Not on file    FAMILY HISTORY Family  History  Problem Relation Age of Onset   Diabetes Mother    Hypertension Father    Hyperlipidemia Father    Breast cancer Maternal Grandmother 20   Liver disease Maternal Grandmother    Diabetes Maternal Grandfather    Kidney disease Maternal Grandfather    Multiple myeloma Paternal Grandmother 38   Breast cancer Paternal Grandmother    Kidney cancer Paternal Grandfather 63   Heart disease Paternal Grandfather    Colon polyps Paternal Uncle    Breast cancer Other        MGA,  PGA   Colon cancer Other        MGU, MGGA x 2   Pancreatic cancer Other        PGA   Breast cancer Paternal Aunt    Pancreatic cancer Maternal Great-grandmother     ALLERGIES:  has No Known Allergies.  MEDICATIONS:  Current Outpatient Medications  Medication Sig Dispense Refill   ferrous sulfate 325 (65 FE) MG tablet Take 1 tablet (325 mg total) by mouth 2 (two)  times daily with a meal. (Patient taking differently: Take 325 mg by mouth daily.) 60 tablet 2   No current facility-administered medications for this visit.    PHYSICAL EXAMINATION:  ECOG PERFORMANCE STATUS: 0 - Asymptomatic   There were no vitals filed for this visit.   There were no vitals filed for this visit.    Physical Exam Vitals and nursing note reviewed.  Constitutional:      Appearance: Normal appearance. He is not diaphoretic.  HENT:     Head: Normocephalic and atraumatic.     Right Ear: External ear normal.     Left Ear: External ear normal.     Nose: Nose normal.  Eyes:     General: No scleral icterus.    Conjunctiva/sclera: Conjunctivae normal.     Pupils: Pupils are equal, round, and reactive to light.  Cardiovascular:     Rate and Rhythm: Normal rate and regular rhythm.     Heart sounds: No murmur heard.    No friction rub. No gallop.  Pulmonary:     Effort: Pulmonary effort is normal. No respiratory distress.     Breath sounds: Normal breath sounds. No stridor. No wheezing.  Abdominal:     General: Bowel sounds are normal. There is no distension.     Tenderness: There is no abdominal tenderness. There is no guarding or rebound.     Hernia: No hernia is present.  Musculoskeletal:        General: No swelling, tenderness, deformity or signs of injury. Normal range of motion.     Cervical back: Normal range of motion and neck supple. No rigidity or tenderness.     Right lower leg: No edema.     Left lower leg: No edema.  Lymphadenopathy:     Head:     Right side of head: No submental, submandibular, tonsillar, preauricular, posterior auricular or occipital adenopathy.     Left side of head: No submental, submandibular, tonsillar, preauricular, posterior auricular or occipital adenopathy.     Cervical: No cervical adenopathy.     Right cervical: No superficial, deep or posterior cervical adenopathy.    Left cervical: No superficial, deep or posterior  cervical adenopathy.     Upper Body:     Right upper body: No supraclavicular or axillary adenopathy.     Left upper body: No supraclavicular or axillary adenopathy.     Lower Body: No right inguinal adenopathy.  No left inguinal adenopathy.  Skin:    Coloration: Skin is not jaundiced or pale.     Findings: No bruising or erythema.  Neurological:     General: No focal deficit present.     Mental Status: He is alert and oriented to person, place, and time.     Cranial Nerves: No cranial nerve deficit.     Motor: No weakness.     Gait: Gait normal.  Psychiatric:        Mood and Affect: Mood normal.        Behavior: Behavior normal.        Thought Content: Thought content normal.        Judgment: Judgment normal.      LABORATORY DATA: I have personally reviewed the data as listed:  No visits with results within 1 Month(s) from this visit.  Latest known visit with results is:  Appointment on 10/09/2022  Component Date Value Ref Range Status   Ferritin 10/09/2022 50  24 - 336 ng/mL Final   Performed at [facility], [address], [place], [place] [address]    RADIOGRAPHIC STUDIES: I have personally reviewed the radiological images as listed and agree with the findings in the report  No results found.  ASSESSMENT/PLAN  22 year old male with history of large rectal polyp and anemia  Rectal polyp-  August 19 2021-- Presented with symptomatic anemia.  Colonoscopy showed 4 cm rectal polyp. CT CAP rectal thickening with mass like appearance  October 05 2021-- Transanal resection of distal rectal tubular adenoma 5 cm with dysplasia and a 6 mm pseudopolyp.  October 09 2022-- Will refer patient back to GI for consideration of repeat colonoscopy  Anemia  January 2023- Hgb 3.0.  Found to have rectal polyp and EGD showed gastritis.  Received PRBC's and IV iron  October through November 2023-- Received 1000 mg Venofer.  October 09 2022- Hgb 15.8; ferritin 50.  Anemia  repaired  Family history of malignancy and personal history of premalignant lesion  October 09 2022-- Genetic testing recommended.  Patient declines testing because he is concerned that it will affect potential military career         Cancer Staging  No matching staging information was found for the patient.   No problem-specific Assessment & Plan notes found for this encounter.    No orders of the defined types were placed in this encounter.   31  minutes was spent in patient care.  This included time spent preparing to see the patient (e.g., review of tests), obtaining and/or reviewing separately obtained history, counseling and educating the patient, ordering tests, or procedures; documenting clinical information in the electronic or other health record, independently interpreting results and communicating results to the patient as well as coordination of care.       All questions were answered. The patient knows to call the clinic with any problems, questions or concerns.  This note was electronically signed.    Loni Muse, MD  01/02/2023 8:45 AM

## 2023-01-05 DIAGNOSIS — S59901A Unspecified injury of right elbow, initial encounter: Secondary | ICD-10-CM | POA: Diagnosis not present

## 2023-01-09 ENCOUNTER — Other Ambulatory Visit: Payer: Commercial Managed Care - PPO

## 2023-01-09 ENCOUNTER — Ambulatory Visit: Payer: Commercial Managed Care - PPO | Admitting: Oncology

## 2023-01-11 DIAGNOSIS — Z1331 Encounter for screening for depression: Secondary | ICD-10-CM | POA: Diagnosis not present

## 2023-01-11 DIAGNOSIS — Z1339 Encounter for screening examination for other mental health and behavioral disorders: Secondary | ICD-10-CM | POA: Diagnosis not present

## 2023-01-11 DIAGNOSIS — Z6828 Body mass index (BMI) 28.0-28.9, adult: Secondary | ICD-10-CM | POA: Diagnosis not present

## 2023-01-11 DIAGNOSIS — M25521 Pain in right elbow: Secondary | ICD-10-CM | POA: Diagnosis not present

## 2023-06-26 ENCOUNTER — Ambulatory Visit: Payer: Commercial Managed Care - PPO | Admitting: Gastroenterology

## 2023-06-26 NOTE — Progress Notes (Deleted)
Chief Complaint: Primary GI MD:  HPI:  *** is a  ***  who was referred to me by Lise Auer, MD for a complaint of *** .        PREVIOUS GI WORKUP     Past Medical History:  Diagnosis Date   Anemia    Family history of breast cancer    Family history of colon cancer    Family history of pancreatic cancer    Hemorrhoids    Rectal mass     Past Surgical History:  Procedure Laterality Date   ANTERIOR CRUCIATE LIGAMENT REPAIR Left 07/04/2021   Procedure: LEFT KNEE ANTERIOR CRUCIATE LIGAMENT RECONSTRUCTION WITH QUADRICEP AUTOGRAFT AND MEDIAL MENISCAL REPAIR VERSUS DEBRIDEMENT;  Surgeon: Cammy Copa, MD;  Location: [facility] OR;  Service: Orthopedics;  Laterality: Left;   BIOPSY  08/20/2021   Procedure: BIOPSY;  Surgeon: Jeani Hawking, MD;  Location: [facility] ENDOSCOPY;  Service: Endoscopy;;   BIOPSY  08/21/2021   Procedure: BIOPSY;  Surgeon: Tressia Danas, MD;  Location: [facility] ENDOSCOPY;  Service: Gastroenterology;;   COLONOSCOPY WITH PROPOFOL N/A 08/21/2021   Procedure: COLONOSCOPY WITH PROPOFOL;  Surgeon: Tressia Danas, MD;  Location: [facility] ENDOSCOPY;  Service: Gastroenterology;  Laterality: N/A;   ESOPHAGOGASTRODUODENOSCOPY (EGD) WITH PROPOFOL N/A 08/20/2021   Procedure: ESOPHAGOGASTRODUODENOSCOPY (EGD) WITH PROPOFOL;  Surgeon: Jeani Hawking, MD;  Location: [facility] ENDOSCOPY;  Service: Endoscopy;  Laterality: N/A;   PARTIAL PROCTECTOMY BY TEM N/A 10/05/2021   Procedure: TEM PARTIAL PORCTECTOMY OF RECTAL MASS, PARTIAL RESECTION OF RECTAL MASS, PARTIAL PROCTECTOMY;  Surgeon: Karie Soda, MD;  Location: [facility] ORS;  Service: General;  Laterality: N/A;    Current Outpatient Medications  Medication Sig Dispense Refill   ferrous sulfate 325 (65 FE) MG tablet Take 1 tablet (325 mg total) by mouth 2 (two) times daily with a meal. (Patient taking differently: Take 325 mg by mouth daily.) 60 tablet 2   No current facility-administered medications for this visit.    Allergies as of 06/26/2023    (No Known Allergies)    Family History  Problem Relation Age of Onset   Diabetes Mother    Hypertension Father    Hyperlipidemia Father    Breast cancer Maternal Grandmother 17   Liver disease Maternal Grandmother    Diabetes Maternal Grandfather    Kidney disease Maternal Grandfather    Multiple myeloma Paternal Grandmother 64   Breast cancer Paternal Grandmother    Kidney cancer Paternal Grandfather 58   Heart disease Paternal Grandfather    Colon polyps Paternal Uncle    Breast cancer Other        MGA,  PGA   Colon cancer Other        MGU, MGGA x 2   Pancreatic cancer Other        PGA   Breast cancer Paternal Aunt    Pancreatic cancer Maternal Great-grandmother     Social History   Socioeconomic History   Marital status: Single    Spouse name: Not on file   Number of children: 0   Years of education: Not on file   Highest education level: Not on file  Occupational History   Occupation: Dietitian  Tobacco Use   Smoking status: Never   Smokeless tobacco: Never  Vaping Use   Vaping status: Never Used  Substance and Sexual Activity   Alcohol use: Never   Drug use: Never   Sexual activity: Not Currently  Other Topics Concern   Not on file  Social History Narrative   Not on file   Social Determinants of Health   Financial Resource Strain: Not on file  Food Insecurity: Not on file  Transportation Needs: Not on file  Physical Activity: Not on file  Stress: Not on file  Social Connections: Not on file  Intimate Partner Violence: Not on file    Review of Systems:    Constitutional: No weight loss, fever, chills, weakness or fatigue HEENT: Eyes: No change in vision               Ears, Nose, Throat:  No change in hearing or congestion Skin: No rash or itching Cardiovascular: No chest pain, chest pressure or palpitations   Respiratory: No SOB or cough Gastrointestinal: See HPI and otherwise negative Genitourinary: No dysuria or change in  urinary frequency Neurological: No headache, dizziness or syncope Musculoskeletal: No new muscle or joint pain Hematologic: No bleeding or bruising Psychiatric: No history of depression or anxiety    Physical Exam:  Vital signs: There were no vitals taken for this visit.  Constitutional: NAD, Well developed, Well nourished, alert and cooperative Head:  Normocephalic and atraumatic. Eyes:   PEERL, EOMI. No icterus. Conjunctiva pink. Respiratory: Respirations even and unlabored. Lungs clear to auscultation bilaterally.   No wheezes, crackles, or rhonchi.  Cardiovascular:  Regular rate and rhythm. No peripheral edema, cyanosis or pallor.  Gastrointestinal:  Soft, nondistended, nontender. No rebound or guarding. Normal bowel sounds. No appreciable masses or hepatomegaly. Rectal:  Not performed.  Msk:  Symmetrical without gross deformities. Without edema, no deformity or joint abnormality.  Neurologic:  Alert and  oriented x4;  grossly normal neurologically.  Skin:   Dry and intact without significant lesions or rashes. Psychiatric: Oriented to person, place and time. Demonstrates good judgement and reason without abnormal affect or behaviors.   RELEVANT LABS AND IMAGING: CBC    Component Value Date/Time   WBC 7.3 10/09/2022 0939   RBC 5.07 10/09/2022 0939   HGB 15.8 10/09/2022 0939   HGB 15.3 07/05/2022 0848   HCT 47.5 10/09/2022 0939   PLT 275 10/09/2022 0939   PLT 266 07/05/2022 0848   MCV 93.7 10/09/2022 0939   MCH 31.2 10/09/2022 0939   MCHC 33.3 10/09/2022 0939   RDW 13.4 10/09/2022 0939   LYMPHSABS 1.2 10/09/2022 0939   MONOABS 0.8 10/09/2022 0939   EOSABS 0.1 10/09/2022 0939   BASOSABS 0.1 10/09/2022 0939    CMP     Component Value Date/Time   NA 137 10/09/2022 0939   K 4.3 10/09/2022 0939   CL 103 10/09/2022 0939   CO2 25 10/09/2022 0939   GLUCOSE 89 10/09/2022 0939   BUN 18 10/09/2022 0939   CREATININE 0.86 10/09/2022 0939   CREATININE 0.72 05/09/2022 1336    CALCIUM 9.1 10/09/2022 0939   PROT 7.7 10/09/2022 0939   ALBUMIN 4.6 10/09/2022 0939   AST 21 10/09/2022 0939   AST 21 05/09/2022 1336   ALT 20 10/09/2022 0939   ALT 20 05/09/2022 1336   ALKPHOS 86 10/09/2022 0939   BILITOT 1.7 (H) 10/09/2022 0939   BILITOT 1.6 (H) 05/09/2022 1336   GFRNONAA >60 10/09/2022 0939   GFRNONAA >60 05/09/2022 1336   GFRAA NOT CALCULATED 06/29/2016 1826     Assessment/Plan:       Lara [facility]ch [facility] 06/26/2023, 11:09 AM  Cc: Lise Auer, MD

## 2023-09-27 ENCOUNTER — Ambulatory Visit: Payer: Commercial Managed Care - PPO | Admitting: Physician Assistant

## 2023-09-27 ENCOUNTER — Encounter: Payer: Self-pay | Admitting: Physician Assistant

## 2023-09-27 ENCOUNTER — Other Ambulatory Visit (INDEPENDENT_AMBULATORY_CARE_PROVIDER_SITE_OTHER): Payer: Commercial Managed Care - PPO

## 2023-09-27 VITALS — BP 118/68 | HR 63 | Ht 73.0 in | Wt 221.0 lb

## 2023-09-27 DIAGNOSIS — Z860101 Personal history of adenomatous and serrated colon polyps: Secondary | ICD-10-CM

## 2023-09-27 DIAGNOSIS — Z09 Encounter for follow-up examination after completed treatment for conditions other than malignant neoplasm: Secondary | ICD-10-CM | POA: Diagnosis not present

## 2023-09-27 DIAGNOSIS — D508 Other iron deficiency anemias: Secondary | ICD-10-CM

## 2023-09-27 DIAGNOSIS — Z862 Personal history of diseases of the blood and blood-forming organs and certain disorders involving the immune mechanism: Secondary | ICD-10-CM

## 2023-09-27 DIAGNOSIS — D126 Benign neoplasm of colon, unspecified: Secondary | ICD-10-CM

## 2023-09-27 LAB — CBC WITH DIFFERENTIAL/PLATELET
Basophils Absolute: 0.1 10*3/uL (ref 0.0–0.1)
Basophils Relative: 1 % (ref 0.0–3.0)
Eosinophils Absolute: 0 10*3/uL (ref 0.0–0.7)
Eosinophils Relative: 0.6 % (ref 0.0–5.0)
HCT: 48 % (ref 39.0–52.0)
Hemoglobin: 16.2 g/dL (ref 13.0–17.0)
Lymphocytes Relative: 15.3 % (ref 12.0–46.0)
Lymphs Abs: 1.2 10*3/uL (ref 0.7–4.0)
MCHC: 33.7 g/dL (ref 30.0–36.0)
MCV: 94.2 fl (ref 78.0–100.0)
Monocytes Absolute: 0.9 10*3/uL (ref 0.1–1.0)
Monocytes Relative: 12.1 % — ABNORMAL HIGH (ref 3.0–12.0)
Neutro Abs: 5.5 10*3/uL (ref 1.4–7.7)
Neutrophils Relative %: 71 % (ref 43.0–77.0)
Platelets: 258 10*3/uL (ref 150.0–400.0)
RBC: 5.1 Mil/uL (ref 4.22–5.81)
RDW: 13 % (ref 11.5–14.6)
WBC: 7.7 10*3/uL (ref 4.5–10.5)

## 2023-09-27 LAB — COMPREHENSIVE METABOLIC PANEL
ALT: 14 U/L (ref 0–53)
AST: 14 U/L (ref 0–37)
Albumin: 4.9 g/dL (ref 3.5–5.2)
Alkaline Phosphatase: 76 U/L (ref 39–117)
BUN: 21 mg/dL (ref 6–23)
CO2: 25 meq/L (ref 19–32)
Calcium: 9.5 mg/dL (ref 8.4–10.5)
Chloride: 104 meq/L (ref 96–112)
Creatinine, Ser: 0.96 mg/dL (ref 0.40–1.50)
GFR: 114.06 mL/min (ref >60.00)
Glucose, Bld: 73 mg/dL (ref 70–99)
Potassium: 4.1 meq/L (ref 3.5–5.1)
Sodium: 138 meq/L (ref 135–145)
Total Bilirubin: 1.7 mg/dL — ABNORMAL HIGH (ref 0.2–1.2)
Total Protein: 8.1 g/dL (ref 6.0–8.3)

## 2023-09-27 LAB — IBC + FERRITIN
Ferritin: 112 ng/mL (ref 22.0–322.0)
Iron: 163 ug/dL (ref 42–165)
Saturation Ratios: 38.2 % (ref 20.0–50.0)
TIBC: 427 ug/dL (ref 250.0–450.0)
Transferrin: 305 mg/dL (ref 212.0–360.0)

## 2023-09-27 MED ORDER — SUFLAVE 178.7 G PO SOLR: 1.0000 | Freq: Once | ORAL | 0 refills | Status: AC

## 2023-09-27 NOTE — Patient Instructions (Addendum)
 Your provider has requested that you go to the basement level for lab work before leaving today. Press "B" on the elevator. The lab is located at the first door on the left as you exit the elevator.   You have been scheduled for a colonoscopy. Please follow written instructions given to you at your visit today.   If you use inhalers (even only as needed), please bring them with you on the day of your procedure.  DO NOT TAKE 7 DAYS PRIOR TO TEST- Trulicity (dulaglutide) Ozempic, Wegovy (semaglutide) Mounjaro (tirzepatide) Bydureon Bcise (exanatide extended release)  DO NOT TAKE 1 DAY PRIOR TO YOUR TEST Rybelsus (semaglutide) Adlyxin (lixisenatide) Victoza (liraglutide) Byetta (exanatide) ___________________________________________________________________________  Bonita Quin will receive your bowel preparation through [facility], which ensures the lowest copay and home delivery, with outreach via text or call from an [phone] number. Please respond promptly to avoid rescheduling of your procedure. If you are interested in alternative options or have any questions regarding your prep, please contact them at [phone] ____________________________________________________________________________  Your Provider Has Sent Your Bowel Prep Regimen To [facility]   [facility] will contact you to verify your information and collect your copay, if applicable. Enjoy the comfort of your home while your prescription is mailed to you, FREE of any shipping charges.   [facility] accepts all major insurance benefits and applies discounts & coupons.  Have additional questions?   Chat: [email or web address] Call: [phone] Email: [email or web address] NCPDP: [id number]  How will [facility] contact you?  With a Welcome phone call,  a Welcome text and a checkout link in text form.  Texts you receive from [phone] Are NOT Spam.  *To set up delivery, you must complete the checkout process via link  or speak to one of the patient care representatives. If [facility] is unable to reach you, your prescription may be delayed.  To avoid long hold times on the phone, you may also utilize the secure chat feature on the [facility] website to request that they call you back for transaction completion or to expedite your concerns.  _______________________________________________________  If your blood pressure at your visit was 140/90 or greater, please contact your primary care physician to follow up on this.  _______________________________________________________  If you are age 21 or older, your body mass index should be between 23-30. Your There is no height or weight on file to calculate BMI. If this is out of the aforementioned range listed, please consider follow up with your Primary Care Provider.  If you are age 21 or younger, your body mass index should be between 19-25. Your There is no height or weight on file to calculate BMI. If this is out of the aformentioned range listed, please consider follow up with your Primary Care Provider.   ________________________________________________________  The [facility] providers would like to encourage you to use [facility] to communicate with providers for non-urgent requests or questions.  Due to long hold times on the telephone, sending your provider a message by [facility] may be a faster and more efficient way to get a response.  Please allow 48 business hours for a response.  Please remember that this is for non-urgent requests.  _______________________________________________________ It was a pleasure to see you today!  Thank you for trusting me with your gastrointestinal care!

## 2023-09-27 NOTE — Progress Notes (Signed)
 Chief Complaint: History of adenoma of the rectum  HPI:    Glenn Walter is a 23 year old male with a past medical history as listed below including a personal history of transanal endoscopic microsurgery 10/05/2021 of a 4 cm distal rectal tubular adenoma with high-grade dysplasia, a family history of colon cancer and pancreatic cancer, previously known to Dr. Orvan Falconer, who presents to clinic today for follow-up and discussion of repeat colonoscopy due to his history of rectal adenoma.    08/19/2021 EGD with gastritis and biopsies negative for H. pylori.  Colonoscopy with a 4 cm rectal polyp pathology negative for carcinoma.    08/21/2021 CT of the chest abdomen pelvis with contrast showed signs of rectal thickening with masslike appearance, likely the site of reported rectal neoplasm.  No definite signs of metastatic disease.    03/28/2022 patient seen in clinic and was status post TEM for large rectal tubular adenoma with high-grade dysplasia.  Apparently initially presented with rectal bleeding and iron deficiency anemia.  Family history is somewhat suggestive of a hereditary cancer syndrome.  At that point labs were ordered, discussed referral to hematology if still iron deficient.  Recommended surveillance colonoscopy in March 2026 or earlier with any new symptoms.  Recommended a colonoscopy for the parents.    10/09/2022 CMP with a bilirubin minimally elevated at 1.7 otherwise normal, CBC and ferritin normal.    10/09/2022 patient followed with oncology for rectal mass.  At that time noted that he enlisted into [employer] with the [employer] canceled because of his recent medical history.  Discussed that he would be able to reenlist in the beginning of 2025 pending a good health review.  He was employed at [employer]  Patient was told to follow-up for repeat colonoscopy.    Today, patient presents to clinic and tells me he is doing very well, no symptoms really at all, not feeling tired, bowel movements  normal.  He plans to join [employer] later this year and he needs to have a "clean bill of health", this includes a repeat colonoscopy that is normal prior to leaving.  Also needs recheck of his labs for iron deficiency anemia.    Denies fever, chills, weight loss, blood in his stool, nausea, vomiting or abdominal pain.  Past Medical History:  Diagnosis Date   Anemia    Family history of breast cancer    Family history of colon cancer    Family history of pancreatic cancer    Hemorrhoids    Rectal mass     Past Surgical History:  Procedure Laterality Date   ANTERIOR CRUCIATE LIGAMENT REPAIR Left 07/04/2021   Procedure: LEFT KNEE ANTERIOR CRUCIATE LIGAMENT RECONSTRUCTION WITH QUADRICEP AUTOGRAFT AND MEDIAL MENISCAL REPAIR VERSUS DEBRIDEMENT;  Surgeon: Cammy Copa, MD;  Location: [facility] OR;  Service: Orthopedics;  Laterality: Left;   BIOPSY  08/20/2021   Procedure: BIOPSY;  Surgeon: Jeani Hawking, MD;  Location: [facility] ENDOSCOPY;  Service: Endoscopy;;   BIOPSY  08/21/2021   Procedure: BIOPSY;  Surgeon: Tressia Danas, MD;  Location: [facility] ENDOSCOPY;  Service: Gastroenterology;;   COLONOSCOPY WITH PROPOFOL N/A 08/21/2021   Procedure: COLONOSCOPY WITH PROPOFOL;  Surgeon: Tressia Danas, MD;  Location: [facility] ENDOSCOPY;  Service: Gastroenterology;  Laterality: N/A;   ESOPHAGOGASTRODUODENOSCOPY (EGD) WITH PROPOFOL N/A 08/20/2021   Procedure: ESOPHAGOGASTRODUODENOSCOPY (EGD) WITH PROPOFOL;  Surgeon: Jeani Hawking, MD;  Location: [facility] ENDOSCOPY;  Service: Endoscopy;  Laterality: N/A;   PARTIAL PROCTECTOMY BY TEM N/A 10/05/2021   Procedure: TEM PARTIAL  PORCTECTOMY OF RECTAL MASS, PARTIAL RESECTION OF RECTAL MASS, PARTIAL PROCTECTOMY;  Surgeon: Karie Soda, MD;  Location: [facility] ORS;  Service: General;  Laterality: N/A;    Current Outpatient Medications  Medication Sig Dispense Refill   ferrous sulfate 325 (65 FE) MG tablet Take 1 tablet (325 mg total) by mouth 2 (two) times daily with a meal. (Patient  taking differently: Take 325 mg by mouth daily.) 60 tablet 2   No current facility-administered medications for this visit.    Allergies as of 09/27/2023   (No Known Allergies)    Family History  Problem Relation Age of Onset   Diabetes Mother    Hypertension Father    Hyperlipidemia Father    Breast cancer Maternal Grandmother 56   Liver disease Maternal Grandmother    Diabetes Maternal Grandfather    Kidney disease Maternal Grandfather    Multiple myeloma Paternal Grandmother 5   Breast cancer Paternal Grandmother    Kidney cancer Paternal Grandfather 69   Heart disease Paternal Grandfather    Colon polyps Paternal Uncle    Breast cancer Other        MGA,  PGA   Colon cancer Other        MGU, MGGA x 2   Pancreatic cancer Other        PGA   Breast cancer Paternal Aunt    Pancreatic cancer Maternal Great-grandmother     Social History   Socioeconomic History   Marital status: Single    Spouse name: Not on file   Number of children: 0   Years of education: Not on file   Highest education level: Not on file  Occupational History   Occupation: Dietitian  Tobacco Use   Smoking status: Never   Smokeless tobacco: Never  Vaping Use   Vaping status: Never Used  Substance and Sexual Activity   Alcohol use: Never   Drug use: Never   Sexual activity: Not Currently  Other Topics Concern   Not on file  Social History Narrative   Not on file   Social Drivers of Health   Financial Resource Strain: Not on file  Food Insecurity: Not on file  Transportation Needs: Not on file  Physical Activity: Not on file  Stress: Not on file  Social Connections: Not on file  Intimate Partner Violence: Not on file    Review of Systems:    Constitutional: No weight loss, fever or chills Cardiovascular: No chest pain Respiratory: No SOB  Gastrointestinal: See HPI and otherwise negative   Physical Exam:  Vital signs: BP 118/68   Pulse 63   Ht 6\' 1"  (1.854 m)    Wt 221 lb (100.2 kg)   BMI 29.16 kg/m   Constitutional:   Pleasant Caucasian male appears to be in NAD, Well developed, Well nourished, alert and cooperative Respiratory: Respirations even and unlabored. Lungs clear to auscultation bilaterally.   No wheezes, crackles, or rhonchi.  Cardiovascular: Normal S1, S2. No MRG. Regular rate and rhythm. No peripheral edema, cyanosis or pallor.  Gastrointestinal:  Soft, nondistended, nontender. No rebound or guarding. Normal bowel sounds. No appreciable masses or hepatomegaly. Rectal:  Not performed.  Psychiatric: Demonstrates good judgement and reason without abnormal affect or behaviors.  RELEVANT LABS AND IMAGING: CBC    Component Value Date/Time   WBC 7.3 10/09/2022 0939   RBC 5.07 10/09/2022 0939   HGB 15.8 10/09/2022 0939   HGB 15.3 07/05/2022 0848   HCT 47.5 10/09/2022 0939  PLT 275 10/09/2022 0939   PLT 266 07/05/2022 0848   MCV 93.7 10/09/2022 0939   MCH 31.2 10/09/2022 0939   MCHC 33.3 10/09/2022 0939   RDW 13.4 10/09/2022 0939   LYMPHSABS 1.2 10/09/2022 0939   MONOABS 0.8 10/09/2022 0939   EOSABS 0.1 10/09/2022 0939   BASOSABS 0.1 10/09/2022 0939    CMP     Component Value Date/Time   NA 137 10/09/2022 0939   K 4.3 10/09/2022 0939   CL 103 10/09/2022 0939   CO2 25 10/09/2022 0939   GLUCOSE 89 10/09/2022 0939   BUN 18 10/09/2022 0939   CREATININE 0.86 10/09/2022 0939   CREATININE 0.72 05/09/2022 1336   CALCIUM 9.1 10/09/2022 0939   PROT 7.7 10/09/2022 0939   ALBUMIN 4.6 10/09/2022 0939   AST 21 10/09/2022 0939   AST 21 05/09/2022 1336   ALT 20 10/09/2022 0939   ALT 20 05/09/2022 1336   ALKPHOS 86 10/09/2022 0939   BILITOT 1.7 (H) 10/09/2022 0939   BILITOT 1.6 (H) 05/09/2022 1336   GFRNONAA >60 10/09/2022 0939   GFRNONAA >60 05/09/2022 1336   GFRAA NOT CALCULATED 06/29/2016 1826    Assessment: 1.  History of rectal adenoma with high-grade dysplasia: Initial colonoscopy in 2023, removal of adenoma in March  2023, seems to be doing well right now 2.  History of iron deficiency anemia: History of anemia related to above, this has not been rechecked over the past year, patient feels well today  Plan: 1.  Recheck labs today including a CBC, CMP and iron studies with ferritin 2.  Scheduled patient for repeat surveillance colonoscopy given history of rectal adenoma with high-grade dysplasia.  This was scheduled Dr. Barron Alvine in the [facility].  Did provide the patient a detailed list of risks for the procedure and he agrees to proceed. Patient is appropriate for endoscopic procedure(s) in the ambulatory ([facility]) setting.  3.  Patient to follow in clinic per recommendations after labs and procedures above.  Hyacinth Meeker, PA-C [facility] 09/27/2023, 8:42 AM  Cc: Lise Auer, MD

## 2023-09-30 NOTE — Progress Notes (Signed)
 Agree with the assessment and plan as outlined by Hyacinth Meeker, PA-C. ? ?Orman Matsumura, DO, FACG ? ?

## 2023-10-29 ENCOUNTER — Encounter: Payer: Self-pay | Admitting: Gastroenterology

## 2023-11-06 ENCOUNTER — Encounter: Payer: Self-pay | Admitting: Gastroenterology

## 2023-11-06 ENCOUNTER — Ambulatory Visit: Payer: Commercial Managed Care - PPO | Admitting: Gastroenterology

## 2023-11-06 VITALS — BP 116/71 | HR 52 | Temp 98.1°F | Resp 11 | Ht 73.0 in | Wt 221.0 lb

## 2023-11-06 DIAGNOSIS — K562 Volvulus: Secondary | ICD-10-CM

## 2023-11-06 DIAGNOSIS — K621 Rectal polyp: Secondary | ICD-10-CM

## 2023-11-06 DIAGNOSIS — D508 Other iron deficiency anemias: Secondary | ICD-10-CM

## 2023-11-06 DIAGNOSIS — Z800 Family history of malignant neoplasm of digestive organs: Secondary | ICD-10-CM

## 2023-11-06 DIAGNOSIS — D128 Benign neoplasm of rectum: Secondary | ICD-10-CM

## 2023-11-06 DIAGNOSIS — Z860101 Personal history of adenomatous and serrated colon polyps: Secondary | ICD-10-CM

## 2023-11-06 MED ORDER — SODIUM CHLORIDE 0.9 % IV SOLN
500.0000 mL | INTRAVENOUS | Status: DC
Start: 2023-11-06 — End: 2023-11-06

## 2023-11-06 NOTE — Progress Notes (Signed)
 Vss nad trans to pacu

## 2023-11-06 NOTE — Progress Notes (Signed)
 GASTROENTEROLOGY PROCEDURE H&P NOTE   Primary Care Physician: Lise Auer, MD    Reason for Procedure:  Rectal adenoma, polyp surveillance  Plan:    Colonoscopy  Patient is appropriate for endoscopic procedure(s) in the ambulatory ([facility]) setting.  The nature of the procedure, as well as the risks, benefits, and alternatives were carefully and thoroughly reviewed with the patient. Ample time for discussion and questions allowed. The patient understood, was satisfied, and agreed to proceed.     HPI: Glenn Walter is a 23 y.o. male with a history of transanal endoscopic microsurgery 10/05/2021 of a 4 cm distal rectal tubular adenoma with high-grade dysplasia (clear margins), a family history of colon cancer and pancreatic cancer ,who presents for repeat colonoscopy for continued surveillance.   Past Medical History:  Diagnosis Date   Anemia    Family history of breast cancer    Family history of colon cancer    Family history of pancreatic cancer    Hemorrhoids    Rectal mass     Past Surgical History:  Procedure Laterality Date   ANTERIOR CRUCIATE LIGAMENT REPAIR Left 07/04/2021   Procedure: LEFT KNEE ANTERIOR CRUCIATE LIGAMENT RECONSTRUCTION WITH QUADRICEP AUTOGRAFT AND MEDIAL MENISCAL REPAIR VERSUS DEBRIDEMENT;  Surgeon: Cammy Copa, MD;  Location: [facility] OR;  Service: Orthopedics;  Laterality: Left;   BIOPSY  08/20/2021   Procedure: BIOPSY;  Surgeon: Jeani Hawking, MD;  Location: [facility] ENDOSCOPY;  Service: Endoscopy;;   BIOPSY  08/21/2021   Procedure: BIOPSY;  Surgeon: Tressia Danas, MD;  Location: [facility] ENDOSCOPY;  Service: Gastroenterology;;   COLONOSCOPY WITH PROPOFOL N/A 08/21/2021   Procedure: COLONOSCOPY WITH PROPOFOL;  Surgeon: Tressia Danas, MD;  Location: [facility] ENDOSCOPY;  Service: Gastroenterology;  Laterality: N/A;   ESOPHAGOGASTRODUODENOSCOPY (EGD) WITH PROPOFOL N/A 08/20/2021   Procedure: ESOPHAGOGASTRODUODENOSCOPY (EGD) WITH PROPOFOL;  Surgeon: Jeani Hawking,  MD;  Location: [facility] ENDOSCOPY;  Service: Endoscopy;  Laterality: N/A;   PARTIAL PROCTECTOMY BY TEM N/A 10/05/2021   Procedure: TEM PARTIAL PORCTECTOMY OF RECTAL MASS, PARTIAL RESECTION OF RECTAL MASS, PARTIAL PROCTECTOMY;  Surgeon: Karie Soda, MD;  Location: [facility] ORS;  Service: General;  Laterality: N/A;    Prior to Admission medications   Medication Sig Start Date End Date Taking? Authorizing Provider  ferrous sulfate 325 (65 FE) MG tablet Take 1 tablet (325 mg total) by mouth 2 (two) times daily with a meal. Patient not taking: Reported on 11/06/2023 08/22/21 05/09/22  Hughie Closs, MD    Current Outpatient Medications  Medication Sig Dispense Refill   ferrous sulfate 325 (65 FE) MG tablet Take 1 tablet (325 mg total) by mouth 2 (two) times daily with a meal. (Patient not taking: Reported on 11/06/2023) 60 tablet 2   Current Facility-Administered Medications  Medication Dose Route Frequency Provider Last Rate Last Admin   0.9 %  sodium chloride infusion  500 mL Intravenous Continuous James Lafalce V, DO        Allergies as of 11/06/2023   (No Known Allergies)    Family History  Problem Relation Age of Onset   Diabetes Mother    Hypertension Father    Hyperlipidemia Father    Breast cancer Maternal Grandmother 32   Liver disease Maternal Grandmother    Diabetes Maternal Grandfather    Kidney disease Maternal Grandfather    Multiple myeloma Paternal Grandmother 23   Breast cancer Paternal Grandmother    Kidney cancer Paternal Grandfather 42   Heart disease Paternal Grandfather    Colon polyps Paternal Uncle  Breast cancer Other        MGA,  PGA   Colon cancer Other        MGU, MGGA x 2   Pancreatic cancer Other        PGA   Breast cancer Paternal Aunt    Pancreatic cancer Maternal Great-grandmother     Social History   Socioeconomic History   Marital status: Single    Spouse name: Not on file   Number of children: 0   Years of education: Not on file   Highest  education level: Not on file  Occupational History   Occupation: Dietitian  Tobacco Use   Smoking status: Never   Smokeless tobacco: Never  Vaping Use   Vaping status: Never Used  Substance and Sexual Activity   Alcohol use: Never   Drug use: Never   Sexual activity: Not Currently  Other Topics Concern   Not on file  Social History Narrative   Not on file   Social Drivers of Health   Financial Resource Strain: Not on file  Food Insecurity: Not on file  Transportation Needs: Not on file  Physical Activity: Not on file  Stress: Not on file  Social Connections: Not on file  Intimate Partner Violence: Not on file    Physical Exam: Vital signs in last 24 hours: @BP  (!) 135/57   Pulse 65   Temp 98.1 F (36.7 C)   Ht 6\' 1"  (1.854 m)   Wt 221 lb (100.2 kg)   SpO2 100%   BMI 29.16 kg/m  GEN: NAD EYE: Sclerae anicteric ENT: MMM CV: Non-tachycardic Pulm: CTA b/l GI: Soft, NT/ND NEURO:  Alert & Oriented x 3   Doristine Locks, DO [facility]   11/06/2023 10:52 AM

## 2023-11-06 NOTE — Progress Notes (Signed)
 Called to room to assist during endoscopic procedure.  Patient ID and intended procedure confirmed with present staff. Received instructions for my participation in the procedure from the performing physician.

## 2023-11-06 NOTE — Patient Instructions (Signed)
 Discharge instructions given. Handout on polyps. resume previous medications. YOU HAD AN ENDOSCOPIC PROCEDURE TODAY AT THE  ENDOSCOPY CENTER:   Refer to the procedure report that was given to you for any specific questions about what was found during the examination.  If the procedure report does not answer your questions, please call your gastroenterologist to clarify.  If you requested that your care partner not be given the details of your procedure findings, then the procedure report has been included in a sealed envelope for you to review at your convenience later.  YOU SHOULD EXPECT: Some feelings of bloating in the abdomen. Passage of more gas than usual.  Walking can help get rid of the air that was put into your GI tract during the procedure and reduce the bloating. If you had a lower endoscopy (such as a colonoscopy or flexible sigmoidoscopy) you may notice spotting of blood in your stool or on the toilet paper. If you underwent a bowel prep for your procedure, you may not have a normal bowel movement for a few days.  Please Note:  You might notice some irritation and congestion in your nose or some drainage.  This is from the oxygen used during your procedure.  There is no need for concern and it should clear up in a day or so.  SYMPTOMS TO REPORT IMMEDIATELY:  Following lower endoscopy (colonoscopy or flexible sigmoidoscopy):  Excessive amounts of blood in the stool  Significant tenderness or worsening of abdominal pains  Swelling of the abdomen that is new, acute  Fever of 100F or higher   For urgent or emergent issues, a gastroenterologist can be reached at any hour by calling [phone]. Do not use MyChart messaging for urgent concerns.    DIET:  We do recommend a small meal at first, but then you may proceed to your regular diet.  Drink plenty of fluids but you should avoid alcoholic beverages for 24 hours.  ACTIVITY:  You should plan to take it easy for the rest  of today and you should NOT DRIVE or use heavy machinery until tomorrow (because of the sedation medicines used during the test).    FOLLOW UP: Our staff will call the number listed on your records the next business day following your procedure.  We will call around 7:15- 8:00 am to check on you and address any questions or concerns that you may have regarding the information given to you following your procedure. If we do not reach you, we will leave a message.     If any biopsies were taken you will be contacted by phone or by letter within the next 1-3 weeks.  Please call us at [phone] if you have not heard about the biopsies in 3 weeks.    SIGNATURES/CONFIDENTIALITY: You and/or your care partner have signed paperwork which will be entered into your electronic medical record.  These signatures attest to the fact that that the information above on your After Visit Summary has been reviewed and is understood.  Full responsibility of the confidentiality of this discharge information lies with you and/or your care-partner.

## 2023-11-06 NOTE — Op Note (Signed)
 [facility] Patient Name: Glenn Walter Procedure Date: 11/06/2023 10:44 AM MRN: [id number] Endoscopist: Doristine Locks , MD, [id number] Age: 23 Referring MD:  Date of Birth: Dec 06, 2000 Gender: Male Account #: [id number] Procedure:                Colonoscopy Indications:              High risk colon cancer surveillance: Personal                            history of colonic polyps                           Colonoscopy in 07/2021 with 30 mm lesion in the                            distal rectum, now s/p transanal endoscopic                            microsurgery 10/05/2021 of a 4 cm distal rectal                            tubular adenoma with high-grade dysplasia (clear                            margins), presents today for ongoing surveillance. Medicines:                Monitored Anesthesia Care Procedure:                Pre-Anesthesia Assessment:                           - Prior to the procedure, a History and Physical                            was performed, and patient medications and                            allergies were reviewed. The patient's tolerance of                            previous anesthesia was also reviewed. The risks                            and benefits of the procedure and the sedation                            options and risks were discussed with the patient.                            All questions were answered, and informed consent                            was obtained. Prior Anticoagulants: The patient has  taken no anticoagulant or antiplatelet agents. ASA                            Grade Assessment: II - A patient with mild systemic                            disease. After reviewing the risks and benefits,                            the patient was deemed in satisfactory condition to                            undergo the procedure.                           After obtaining informed consent, the colonoscope                             was passed under direct vision. Throughout the                            procedure, the patient's blood pressure, pulse, and                            oxygen saturations were monitored continuously. The                            Olympus Scope SN: [id number] was introduced through                            the anus and advanced to the the cecum, identified                            by appendiceal orifice and ileocecal valve. The                            colonoscopy was performed without difficulty. The                            patient tolerated the procedure well. The quality                            of the bowel preparation was good. The ileocecal                            valve, appendiceal orifice, and rectum were                            photographed. Scope In: 11:04:46 AM Scope Out: 11:25:06 AM Total Procedure Duration: 0 hours 20 minutes 20 seconds  Findings:                 The perianal and digital rectal examinations were  normal.                           A 2 mm polyp was found in the distal rectum. The                            polyp was sessile. The polyp was removed with a                            cold biopsy forceps. Resection and retrieval were                            complete. Estimated blood loss was minimal.                           A scar was found in the distal rectum, located                            approximately 2 cm form the anal verge. The scar                            tissue was healthy in appearance on both white                            light and narrow band imaging without any residual                            polypod tissue. Out of an abundance of caution,                            biopsies were taken with a cold forceps for                            histology. Estimated blood loss was minimal.                           The exam was otherwise normal throughout the                             remainder of the colon.                           The ascending colon revealed significantly                            excessive looping. Advancing the scope required                            using manual pressure and straightening and                            shortening the scope to obtain bowel loop reduction. Complications:            No immediate complications. Estimated Blood Loss:  Estimated blood loss was minimal. Impression:               - One 2 mm polyp in the distal rectum, removed with                            a cold biopsy forceps. Resected and retrieved.                           - Scar in the distal rectum. Biopsied.                           - There was significant looping of the colon. Recommendation:           - Patient has a contact number available for                            emergencies. The signs and symptoms of potential                            delayed complications were discussed with the                            patient. Return to normal activities tomorrow.                            Written discharge instructions were provided to the                            patient.                           - Resume previous diet.                           - Continue present medications.                           - Await pathology results.                           - Repeat colonoscopy in 3 years for surveillance.                           - Return to GI office PRN. Doristine Locks, MD 11/06/2023 11:34:55 AM

## 2023-11-07 ENCOUNTER — Telehealth: Payer: Self-pay | Admitting: *Deleted

## 2023-11-07 NOTE — Telephone Encounter (Signed)
  Follow up Call-     11/06/2023   10:16 AM  Call back number  Post procedure Call Back phone  # [phone]  Permission to leave phone message Yes    Mailbox full, no message left

## 2023-11-08 LAB — SURGICAL PATHOLOGY

## 2023-11-09 ENCOUNTER — Encounter: Payer: Self-pay | Admitting: Gastroenterology

## 2024-04-15 ENCOUNTER — Encounter: Payer: Self-pay | Admitting: Gastroenterology

## 2024-04-21 DIAGNOSIS — Z1339 Encounter for screening examination for other mental health and behavioral disorders: Secondary | ICD-10-CM | POA: Diagnosis not present

## 2024-04-21 DIAGNOSIS — Z0000 Encounter for general adult medical examination without abnormal findings: Secondary | ICD-10-CM | POA: Diagnosis not present

## 2024-04-21 DIAGNOSIS — Z6830 Body mass index (BMI) 30.0-30.9, adult: Secondary | ICD-10-CM | POA: Diagnosis not present

## 2024-04-21 DIAGNOSIS — Z131 Encounter for screening for diabetes mellitus: Secondary | ICD-10-CM | POA: Diagnosis not present

## 2024-04-21 DIAGNOSIS — Z13220 Encounter for screening for lipoid disorders: Secondary | ICD-10-CM | POA: Diagnosis not present

## 2024-04-21 DIAGNOSIS — Z1331 Encounter for screening for depression: Secondary | ICD-10-CM | POA: Diagnosis not present
